# Patient Record
Sex: Female | Born: 1945 | Race: White | Hispanic: No | Marital: Married | Smoking: Never smoker
Health system: Southern US, Community
[De-identification: ages and names within clinical notes are randomized; demographics above are authoritative.]

## PROBLEM LIST (undated history)

## (undated) DIAGNOSIS — I739 Peripheral vascular disease, unspecified: Secondary | ICD-10-CM

## (undated) DIAGNOSIS — M1990 Unspecified osteoarthritis, unspecified site: Secondary | ICD-10-CM

## (undated) DIAGNOSIS — G4730 Sleep apnea, unspecified: Secondary | ICD-10-CM

## (undated) DIAGNOSIS — F419 Anxiety disorder, unspecified: Secondary | ICD-10-CM

## (undated) HISTORY — PX: OTHER SURGICAL HISTORY: SHX169

## (undated) HISTORY — PX: CHOLECYSTECTOMY: SHX55

---

## 2000-08-22 HISTORY — PX: ABDOMINAL HYSTERECTOMY: SHX81

## 2000-08-22 HISTORY — PX: APPENDECTOMY: SHX54

## 2013-08-22 HISTORY — PX: COLONOSCOPY: SHX174

## 2020-11-02 NOTE — Patient Instructions (Addendum)
DUE TO COVID-19 ONLY ONE VISITOR IS ALLOWED TO COME WITH YOU AND STAY IN THE WAITING ROOM ONLY DURING PRE OP AND PROCEDURE DAY OF SURGERY. THE 1 VISITOR  MAY VISIT WITH YOU AFTER SURGERY IN YOUR PRIVATE ROOM DURING VISITING HOURS ONLY!  YOU NEED TO HAVE A COVID 19 TEST ON__3/19_____ @_10 :30____, THIS TEST MUST BE DONE BEFORE SURGERY,  COVID TESTING SITE [address] [place] Tracy Jimenez , IT IS ON THE RIGHT GOING OUT [address] APPROXIMATELY  2 MINUTES PAST [employer] ON THE RIGHT. ONCE YOUR COVID TEST IS COMPLETED,  PLEASE BEGIN THE QUARANTINE INSTRUCTIONS AS OUTLINED IN YOUR HANDOUT.                Tracy Jimenez   Your procedure is scheduled on: 11/11/20   Report to [facility] Main  Entrance   Report to admitting at  10:15 AM     Call this number if you have problems the morning of surgery [phone]    . BRUSH YOUR TEETH MORNING OF SURGERY AND RINSE YOUR MOUTH OUT, NO CHEWING GUM CANDY OR MINTS.    ==================================================================  No food after midnight.    You may have clear liquid until 9:30 AM.    At 9:00 AM drink pre surgery drink  . Nothing by mouth after 9:30 AM.     Take these medicines the morning of surgery with A SIP OF WATER: none                                 You may not have any metal on your body including hair pins and              piercings  Do not wear jewelry, make-up, lotions, powders or perfumes, deodorant             Do not wear nail polish on your fingernails.  Do not shave  48 hours prior to surgery.  k.   Do not bring valuables to the hospital. [facility] IS NOT             RESPONSIBLE   FOR VALUABLES.  Contacts, dentures or bridgework may not be worn into surgery.       Special Instructions: N/A              Please read over the following fact sheets you were given: _____________________________________________________________________             [facility] -  Preparing for Surgery Before surgery, you can play an important role.  Because skin is not sterile, your skin needs to be as free of germs as possible.  You can reduce the number of germs on your skin by washing with CHG (chlorahexidine gluconate) soap before surgery.  CHG is an antiseptic cleaner which kills germs and bonds with the skin to continue killing germs even after washing. Please DO NOT use if you have an allergy to CHG or antibacterial soaps.  If your skin becomes reddened/irritated stop using the CHG and inform your nurse when you arrive at Short Stay. Do not shave (including legs and underarms) for at least 48 hours prior to the first CHG shower.    Please follow these instructions carefully:  1.  Shower with CHG Soap the night before surgery and the  morning of Surgery.  2.  If you choose to wash your hair, wash your hair first  as usual with your  normal  shampoo.  3.  After you shampoo, rinse your hair and body thoroughly to remove the  shampoo.                                        4.  Use CHG as you would any other liquid soap.  You can apply chg directly  to the skin and wash                       Gently with a scrungie or clean washcloth.  5.  Apply the CHG Soap to your body ONLY FROM THE NECK DOWN.   Do not use on face/ open                           Wound or open sores. Avoid contact with eyes, ears mouth and genitals (private parts).                       Wash face,  Genitals (private parts) with your normal soap.             6.  Wash thoroughly, paying special attention to the area where your surgery  will be performed.  7.  Thoroughly rinse your body with warm water from the neck down.  8.  DO NOT shower/wash with your normal soap after using and rinsing off  the CHG Soap.             9.  Pat yourself dry with a clean towel.            10.  Wear clean pajamas.            11.  Place clean sheets on your bed the night of your first shower and do not  sleep with pets. Day  of Surgery : Do not apply any lotions/deodorants the morning of surgery.  Please wear clean clothes to the hospital/surgery center.  FAILURE TO FOLLOW THESE INSTRUCTIONS MAY RESULT IN THE CANCELLATION OF YOUR SURGERY PATIENT SIGNATURE_________________________________  NURSE SIGNATURE__________________________________  ________________________________________________________________________   Tracy Jimenez  An incentive spirometer is a tool that can help keep your lungs clear and active. This tool measures how well you are filling your lungs with each breath. Taking long deep breaths may help reverse or decrease the chance of developing breathing (pulmonary) problems (especially infection) following:  A long period of time when you are unable to move or be active. BEFORE THE PROCEDURE   If the spirometer includes an indicator to show your best effort, your nurse or respiratory therapist will set it to a desired goal.  If possible, sit up straight or lean slightly forward. Try not to slouch.  Hold the incentive spirometer in an upright position. INSTRUCTIONS FOR USE  1. Sit on the edge of your bed if possible, or sit up as far as you can in bed or on a chair. 2. Hold the incentive spirometer in an upright position. 3. Breathe out normally. 4. Place the mouthpiece in your mouth and seal your lips tightly around it. 5. Breathe in slowly and as deeply as possible, raising the piston or the ball toward the top of the column. 6. Hold your breath for 3-5 seconds or for as long as possible. Allow the piston or ball to fall  to the bottom of the column. 7. Remove the mouthpiece from your mouth and breathe out normally. 8. Rest for a few seconds and repeat Steps 1 through 7 at least 10 times every 1-2 hours when you are awake. Take your time and take a few normal breaths between deep breaths. 9. The spirometer may include an indicator to show your best effort. Use the indicator as a goal  to work toward during each repetition. 10. After each set of 10 deep breaths, practice coughing to be sure your lungs are clear. If you have an incision (the cut made at the time of surgery), support your incision when coughing by placing a pillow or rolled up towels firmly against it. Once you are able to get out of bed, walk around indoors and cough well. You may stop using the incentive spirometer when instructed by your caregiver.  RISKS AND COMPLICATIONS  Take your time so you do not get dizzy or light-headed.  If you are in pain, you may need to take or ask for pain medication before doing incentive spirometry. It is harder to take a deep breath if you are having pain. AFTER USE  Rest and breathe slowly and easily.  It can be helpful to keep track of a log of your progress. Your caregiver can provide you with a simple table to help with this. If you are using the spirometer at home, follow these instructions: SEEK MEDICAL CARE IF:   You are having difficultly using the spirometer.  You have trouble using the spirometer as often as instructed.  Your pain medication is not giving enough relief while using the spirometer.  You develop fever of 100.5 F (38.1 C) or higher. SEEK IMMEDIATE MEDICAL CARE IF:   You cough up bloody sputum that had not been present before.  You develop fever of 102 F (38.9 C) or greater.  You develop worsening pain at or near the incision site. MAKE SURE YOU:   Understand these instructions.  Will watch your condition.  Will get help right away if you are not doing well or get worse. Document Released: 12/19/2006 Document Revised: 10/31/2011 Document Reviewed: 02/19/2007 [facility] Patient Information 2014 [place], [place].   ________________________________________________________________________

## 2020-11-03 ENCOUNTER — Encounter (HOSPITAL_COMMUNITY)
Admission: RE | Admit: 2020-11-03 | Discharge: 2020-11-03 | Disposition: A | Discharge status: Home / Self Care | Payer: Medicare Other | Source: Ambulatory Visit | Attending: Orthopedic Surgery | Admitting: Orthopedic Surgery

## 2020-11-03 ENCOUNTER — Encounter (HOSPITAL_COMMUNITY): Payer: Self-pay

## 2020-11-03 ENCOUNTER — Other Ambulatory Visit: Payer: Self-pay

## 2020-11-03 DIAGNOSIS — Z01812 Encounter for preprocedural laboratory examination: Secondary | ICD-10-CM | POA: Diagnosis present

## 2020-11-03 HISTORY — DX: Unspecified osteoarthritis, unspecified site: M19.90

## 2020-11-03 HISTORY — DX: Peripheral vascular disease, unspecified: I73.9

## 2020-11-03 HISTORY — DX: Anxiety disorder, unspecified: F41.9

## 2020-11-03 HISTORY — DX: Sleep apnea, unspecified: G47.30

## 2020-11-03 LAB — COMPREHENSIVE METABOLIC PANEL
ALT: 34 U/L (ref 0–44)
AST: 29 U/L (ref 15–41)
Albumin: 4.5 g/dL (ref 3.5–5.0)
Alkaline Phosphatase: 70 U/L (ref 38–126)
Anion gap: 7 (ref 5–15)
BUN: 14 mg/dL (ref 8–23)
CO2: 28 mmol/L (ref 22–32)
Calcium: 9.8 mg/dL (ref 8.9–10.3)
Chloride: 106 mmol/L (ref 98–111)
Creatinine, Ser: 0.44 mg/dL (ref 0.44–1.00)
GFR, Estimated: >60 mL/min (ref >60)
Glucose, Bld: 112 mg/dL — ABNORMAL HIGH (ref 70–99)
Potassium: 4.5 mmol/L (ref 3.5–5.1)
Sodium: 141 mmol/L (ref 135–145)
Total Bilirubin: 0.6 mg/dL (ref 0.3–1.2)
Total Protein: 7.5 g/dL (ref 6.5–8.1)

## 2020-11-03 LAB — SURGICAL PCR SCREEN
MRSA, PCR: NEGATIVE
Staphylococcus aureus: POSITIVE — AB

## 2020-11-03 LAB — CBC
HCT: 43.1 % (ref 36.0–46.0)
Hemoglobin: 14.1 g/dL (ref 12.0–15.0)
MCH: 32 pg (ref 26.0–34.0)
MCHC: 32.7 g/dL (ref 30.0–36.0)
MCV: 98 fL (ref 80.0–100.0)
Platelets: 258 10*3/uL (ref 150–400)
RBC: 4.4 MIL/uL (ref 3.87–5.11)
RDW: 12.5 % (ref 11.5–15.5)
WBC: 6.9 10*3/uL (ref 4.0–10.5)
nRBC: 0 % (ref 0.0–0.2)

## 2020-11-03 LAB — PROTIME-INR
INR: 1 (ref 0.8–1.2)
Prothrombin Time: 12.3 seconds (ref 11.4–15.2)

## 2020-11-03 LAB — APTT: aPTT: 30 seconds (ref 24–36)

## 2020-11-03 NOTE — Progress Notes (Addendum)
COVID Vaccine Completed:Yes Date COVID Vaccine completed:10/24/19-Booster 07/08/20 COVID vaccine manufacturer: Pfizer     PCP - Dr. Allayne Gitelman Cardiologist - none  Chest x-ray - no EKG - on chat- Epic Stress Test - no ECHO - no Cardiac Cath - no Pacemaker/ICD device last checked:NA  Sleep Study - yes CPAP -yes   Fasting Blood Sugar - NA Checks Blood Sugar _____ times a day  Blood Thinner Instructions: ASA 81/ Dr. Chestine Spore Aspirin Instructions:stop 5 days prior/ aluisio Last Dose:11/05/20  Anesthesia review:   Patient denies shortness of breath, fever, cough and chest pain at PAT appointment yes  Patient verbalized understanding of instructions that were given to them at the PAT appointment. Patient was also instructed that they will need to review over the PAT instructions again at home before surgery.yes  Pt is anxious about this surgery. She reports no SOB climbing stairs, doing house work or with ADLs

## 2020-11-04 NOTE — H&P (Signed)
TOTAL HIP ADMISSION H&P  Patient is admitted for left total hip arthroplasty.  Subjective:  Chief Complaint: Left hip pain  HPI: Tracy Jimenez, 75 y.o. female, has a history of pain and functional disability in the left hip due to arthritis and patient has failed non-surgical conservative treatments for greater than 12 weeks to include corticosteriod injections and activity modification. Onset of symptoms was gradual, starting several years ago with gradually worsening course since that time. The patient noted no past surgery on the left hip. Patient currently rates pain in the left hip at 7 out of 10 with activity. Patient has worsening of pain with activity and weight bearing, pain that interfers with activities of daily living, pain with passive range of motion and crepitus. Patient has evidence of periarticular osteophytes and joint space narrowing by imaging studies. This condition presents safety issues increasing the risk of falls. There is no current active infection.  There are no problems to display for this patient.   Past Medical History:  Diagnosis Date  . Anxiety   . Arthritis    knees, hips  . Peripheral vascular disease (HCC)    peripheral neuropathy Rt hand  . Sleep apnea     Past Surgical History:  Procedure Laterality Date  . ABDOMINAL HYSTERECTOMY  2002  . APPENDECTOMY  2002  . carpel tunnel Left   . CHOLECYSTECTOMY    . COLONOSCOPY  2015    Prior to Admission medications   Medication Sig Start Date End Date Taking? Authorizing Provider  aspirin EC 81 MG tablet Take 81 mg by mouth daily. Swallow whole.   Yes [provider]  atorvastatin (LIPITOR) 10 MG tablet Take 10 mg by mouth daily. 10/03/20  Yes [provider]  Calcium Carbonate-Vitamin D (CALCIUM-VITAMIN D) 600-125 MG-UNIT TABS Take 1 tablet by mouth daily.   Yes [provider]  Boris Lown Oil (OMEGA-3) 500 MG CAPS Take 500 mg by mouth daily.   Yes [provider]   Multiple Vitamins-Minerals (MULTIVITAMIN WITH MINERALS) tablet Take 1 tablet by mouth daily.   Yes [provider]  triamcinolone (NASACORT ALLERGY 24HR) 55 MCG/ACT AERO nasal inhaler Place 2 sprays into the nose daily as needed (allergies and Sinus).   Yes [provider]    Allergies  Allergen Reactions  . Mobic [Meloxicam] Hives and Itching  . Kenalog [Triamcinolone] Itching and Rash    Social History   Socioeconomic History  . Marital status: Married    Spouse name: Not on file  . Number of children: Not on file  . Years of education: Not on file  . Highest education level: Not on file  Occupational History  . Not on file  Tobacco Use  . Smoking status: Never Smoker  . Smokeless tobacco: Never Used  Vaping Use  . Vaping Use: Never used  Substance and Sexual Activity  . Alcohol use: Yes    Alcohol/week: 5.0 standard drinks    Types: 5 Standard drinks or equivalent per week  . Drug use: Never  . Sexual activity: Not on file  Other Topics Concern  . Not on file  Social History Narrative  . Not on file   Social Determinants of Health   Financial Resource Strain: Not on file  Food Insecurity: Not on file  Transportation Needs: Not on file  Physical Activity: Not on file  Stress: Not on file  Social Connections: Not on file  Intimate Partner Violence: Not on file    Tobacco Use:  Low Risk   . Smoking Tobacco Use: Never Smoker  . Smokeless Tobacco Use: Never Used   Social History   Substance and Sexual Activity  Alcohol Use Yes  . Alcohol/week: 5.0 standard drinks  . Types: 5 Standard drinks or equivalent per week    No family history on file.  Review of Systems  Constitutional: Negative for chills and fever.  HENT: Negative for congestion, sore throat and tinnitus.   Eyes: Negative for double vision, photophobia and pain.  Respiratory: Negative for cough, shortness of breath and wheezing.   Cardiovascular: Negative for chest pain,  palpitations and orthopnea.  Gastrointestinal: Negative for heartburn, nausea and vomiting.  Genitourinary: Negative for dysuria, frequency and urgency.  Musculoskeletal: Positive for joint pain.  Neurological: Negative for dizziness, weakness and headaches.     Objective:  Physical Exam: Well nourished and well developed.  General: Alert and oriented x3, cooperative and pleasant, no acute distress.  Head: normocephalic, atraumatic, neck supple.  Eyes: EOMI.  Respiratory: breath sounds clear in all fields, no wheezing, rales, or rhonchi. Cardiovascular: Regular rate and rhythm, no murmurs, gallops or rubs.  Abdomen: non-tender to palpation and soft, normoactive bowel sounds. Musculoskeletal:  Left Hip Exam:  ROM: Flexion to 100, Internal Rotation 0, External Rotation 20, and abduction 20 without discomfort.  There is no tenderness over the greater trochanter.  there is no pain on provocative testing of the hip.  Calves soft and nontender. Motor function intact in LE. Strength 5/5 LE bilaterally. Neuro: Distal pulses 2+. Sensation to light touch intact in LE.  Vital signs in last 24 hours: Temp:  [98.5 F (36.9 C)] 98.5 F (36.9 C) (03/15 0949) Pulse Rate:  [71] 71 (03/15 0949) Resp:  [20] 20 (03/15 0949) BP: (157)/(68) 157/68 (03/15 0949) SpO2:  [100 %] 100 % (03/15 0949) Weight:  [57.2 kg] 57.2 kg (03/15 0949)  Imaging Review Plain radiographs demonstrate severe degenerative joint disease of the left hip. The bone quality appears to be adequate for age and reported activity level.  Assessment/Plan:  End stage arthritis, left hip  The patient history, physical examination, clinical judgement of the provider and imaging studies are consistent with end stage degenerative joint disease of the left hip and total hip arthroplasty is deemed medically necessary. The treatment options including medical management, injection therapy, arthroscopy and arthroplasty were discussed at  length. The risks and benefits of total hip arthroplasty were presented and reviewed. The risks due to aseptic loosening, infection, stiffness, dislocation/subluxation, thromboembolic complications and other imponderables were discussed. The patient acknowledged the explanation, agreed to proceed with the plan and consent was signed. Patient is being admitted for inpatient treatment for surgery, pain control, PT, OT, prophylactic antibiotics, VTE prophylaxis, progressive ambulation and ADLs and discharge planning.The patient is planning to be discharged home.   Patient's anticipated LOS is less than 2 midnights, meeting these requirements: - Younger than 81 - Lives within 1 hour of care - Has a competent adult at home to recover with post-op recover - NO history of  - Chronic pain requiring opiods  - Diabetes  - Coronary Artery Disease  - Heart failure  - Heart attack  - Stroke  - DVT/VTE  - Cardiac arrhythmia  - Respiratory Failure/COPD  - Renal failure  - Anemia  - Advanced Liver disease  Therapy Plans: HEP Disposition: Home with husband Planned DVT Prophylaxis: Aspirin 325 mg BID DME Needed: None PCP: Vernona Rieger, NP (clearance received) TXA: IV Allergies: Meloxicam (rash), kenalog (rash)  Anesthesia Concerns: None BMI: 22 Last HgbA1c: Not diabetic.  Pharmacy: [facility] [address], [place])  - Patient was instructed on what medications to stop prior to surgery. - Follow-up visit in 2 weeks with Dr. Lequita Halt - Begin physical therapy following surgery - Pre-operative lab work as pre-surgical testing - Prescriptions will be provided in hospital at time of discharge  Arther Abbott, PA-C Orthopedic Surgery [facility] [place]

## 2020-11-07 ENCOUNTER — Other Ambulatory Visit (HOSPITAL_COMMUNITY)
Admission: RE | Admit: 2020-11-07 | Discharge: 2020-11-07 | Disposition: A | Discharge status: Home / Self Care | Payer: Medicare Other | Source: Ambulatory Visit | Attending: Orthopedic Surgery | Admitting: Orthopedic Surgery

## 2020-11-07 DIAGNOSIS — Z01812 Encounter for preprocedural laboratory examination: Secondary | ICD-10-CM | POA: Diagnosis present

## 2020-11-07 DIAGNOSIS — Z20822 Contact with and (suspected) exposure to covid-19: Secondary | ICD-10-CM | POA: Insufficient documentation

## 2020-11-08 LAB — SARS CORONAVIRUS 2 (TAT 6-24 HRS): SARS Coronavirus 2: NEGATIVE

## 2020-11-11 ENCOUNTER — Other Ambulatory Visit: Payer: Self-pay

## 2020-11-11 ENCOUNTER — Observation Stay (HOSPITAL_COMMUNITY)
Admission: RE | Admit: 2020-11-11 | Discharge: 2020-11-13 | Disposition: A | Discharge status: Home / Self Care | Payer: Medicare Other | Source: Ambulatory Visit | Attending: Orthopedic Surgery | Admitting: Orthopedic Surgery

## 2020-11-11 ENCOUNTER — Ambulatory Visit (HOSPITAL_COMMUNITY): Payer: Medicare Other

## 2020-11-11 ENCOUNTER — Ambulatory Visit (HOSPITAL_COMMUNITY): Payer: Medicare Other | Admitting: Certified Registered"

## 2020-11-11 ENCOUNTER — Encounter (HOSPITAL_COMMUNITY)
Admission: RE | Disposition: A | Discharge status: Home / Self Care | Payer: Self-pay | Source: Ambulatory Visit | Attending: Orthopedic Surgery

## 2020-11-11 ENCOUNTER — Observation Stay (HOSPITAL_COMMUNITY): Payer: Medicare Other

## 2020-11-11 ENCOUNTER — Encounter (HOSPITAL_COMMUNITY): Payer: Self-pay | Admitting: Orthopedic Surgery

## 2020-11-11 DIAGNOSIS — Z888 Allergy status to other drugs, medicaments and biological substances status: Secondary | ICD-10-CM | POA: Insufficient documentation

## 2020-11-11 DIAGNOSIS — M25552 Pain in left hip: Secondary | ICD-10-CM | POA: Diagnosis present

## 2020-11-11 DIAGNOSIS — Z79899 Other long term (current) drug therapy: Secondary | ICD-10-CM | POA: Diagnosis not present

## 2020-11-11 DIAGNOSIS — Z96649 Presence of unspecified artificial hip joint: Secondary | ICD-10-CM

## 2020-11-11 DIAGNOSIS — Z7982 Long term (current) use of aspirin: Secondary | ICD-10-CM | POA: Diagnosis not present

## 2020-11-11 DIAGNOSIS — M169 Osteoarthritis of hip, unspecified: Secondary | ICD-10-CM

## 2020-11-11 DIAGNOSIS — M1612 Unilateral primary osteoarthritis, left hip: Secondary | ICD-10-CM | POA: Diagnosis not present

## 2020-11-11 HISTORY — PX: TOTAL HIP ARTHROPLASTY: SHX124

## 2020-11-11 LAB — TYPE AND SCREEN
ABO/RH(D): O POS
Antibody Screen: NEGATIVE

## 2020-11-11 LAB — ABO/RH: ABO/RH(D): O POS

## 2020-11-11 SURGERY — ARTHROPLASTY, HIP, TOTAL, ANTERIOR APPROACH
Anesthesia: Spinal | Site: Hip | Laterality: Left

## 2020-11-11 MED ORDER — METOCLOPRAMIDE HCL 5 MG PO TABS
5.0000 mg | ORAL_TABLET | Freq: Three times a day (TID) | ORAL | Status: DC | PRN
Start: 2020-11-11 — End: 2020-11-13

## 2020-11-11 MED ORDER — CEFAZOLIN SODIUM-DEXTROSE 2-4 GM/100ML-% IV SOLN
2.0000 g | INTRAVENOUS | Status: AC
  Administered 2020-11-11: 2 g via INTRAVENOUS
  Filled 2020-11-11: qty 100

## 2020-11-11 MED ORDER — LACTATED RINGERS IV SOLN: INTRAVENOUS | Status: DC

## 2020-11-11 MED ORDER — ONDANSETRON HCL 4 MG/2ML IJ SOLN
INTRAMUSCULAR | Status: AC
  Filled 2020-11-11: qty 2

## 2020-11-11 MED ORDER — MIDAZOLAM HCL 2 MG/2ML IJ SOLN
INTRAMUSCULAR | Status: AC
  Filled 2020-11-11: qty 2

## 2020-11-11 MED ORDER — ORAL CARE MOUTH RINSE: 15.0000 mL | Freq: Once | OROMUCOSAL | Status: AC

## 2020-11-11 MED ORDER — BISACODYL 10 MG RE SUPP: 10.0000 mg | Freq: Every day | RECTAL | Status: DC | PRN

## 2020-11-11 MED ORDER — HYDROCODONE-ACETAMINOPHEN 5-325 MG PO TABS
1.0000 | ORAL_TABLET | ORAL | Status: DC | PRN
Start: 2020-11-11 — End: 2020-11-12
  Administered 2020-11-11 (×2): 2 via ORAL
  Filled 2020-11-11 (×2): qty 2

## 2020-11-11 MED ORDER — ONDANSETRON HCL 4 MG/2ML IJ SOLN
4.0000 mg | Freq: Four times a day (QID) | INTRAMUSCULAR | Status: DC | PRN
  Administered 2020-11-11 – 2020-11-12 (×2): 4 mg via INTRAVENOUS
  Filled 2020-11-11 (×2): qty 2

## 2020-11-11 MED ORDER — TRIAMCINOLONE ACETONIDE 55 MCG/ACT NA AERO
2.0000 | INHALATION_SPRAY | Freq: Every day | NASAL | Status: DC | PRN
  Filled 2020-11-11: qty 21.6

## 2020-11-11 MED ORDER — PROPOFOL 10 MG/ML IV BOLUS
INTRAVENOUS | Status: AC
  Filled 2020-11-11: qty 20

## 2020-11-11 MED ORDER — TRANEXAMIC ACID-NACL 1000-0.7 MG/100ML-% IV SOLN
1000.0000 mg | INTRAVENOUS | Status: AC
  Administered 2020-11-11: 1000 mg via INTRAVENOUS
  Filled 2020-11-11: qty 100

## 2020-11-11 MED ORDER — MENTHOL 3 MG MT LOZG: 1.0000 | LOZENGE | OROMUCOSAL | Status: DC | PRN

## 2020-11-11 MED ORDER — METHOCARBAMOL 500 MG PO TABS
500.0000 mg | ORAL_TABLET | Freq: Four times a day (QID) | ORAL | Status: DC | PRN
  Administered 2020-11-11 – 2020-11-12 (×2): 500 mg via ORAL
  Filled 2020-11-11 (×2): qty 1

## 2020-11-11 MED ORDER — ATORVASTATIN CALCIUM 10 MG PO TABS
10.0000 mg | ORAL_TABLET | Freq: Every day | ORAL | Status: DC
  Administered 2020-11-12 – 2020-11-13 (×2): 10 mg via ORAL
  Filled 2020-11-11 (×2): qty 1

## 2020-11-11 MED ORDER — MIDAZOLAM HCL 2 MG/2ML IJ SOLN
INTRAMUSCULAR | Status: DC | PRN
  Administered 2020-11-11 (×2): 1 mg via INTRAVENOUS

## 2020-11-11 MED ORDER — PROPOFOL 500 MG/50ML IV EMUL
INTRAVENOUS | Status: DC | PRN
  Administered 2020-11-11: 40 ug/kg/min via INTRAVENOUS

## 2020-11-11 MED ORDER — TRAMADOL HCL 50 MG PO TABS: 50.0000 mg | ORAL_TABLET | Freq: Four times a day (QID) | ORAL | Status: DC | PRN

## 2020-11-11 MED ORDER — DOCUSATE SODIUM 100 MG PO CAPS
100.0000 mg | ORAL_CAPSULE | Freq: Two times a day (BID) | ORAL | Status: DC
  Administered 2020-11-11 – 2020-11-13 (×3): 100 mg via ORAL
  Filled 2020-11-11 (×3): qty 1

## 2020-11-11 MED ORDER — PHENOL 1.4 % MT LIQD: 1.0000 | OROMUCOSAL | Status: DC | PRN

## 2020-11-11 MED ORDER — ONDANSETRON HCL 4 MG/2ML IJ SOLN
INTRAMUSCULAR | Status: DC | PRN
  Administered 2020-11-11: 4 mg via INTRAVENOUS

## 2020-11-11 MED ORDER — SODIUM CHLORIDE 0.9 % IV SOLN: INTRAVENOUS | Status: DC

## 2020-11-11 MED ORDER — FENTANYL CITRATE (PF) 100 MCG/2ML IJ SOLN
INTRAMUSCULAR | Status: DC | PRN
  Administered 2020-11-11 (×2): 25 ug via INTRAVENOUS
  Administered 2020-11-11: 50 ug via INTRAVENOUS

## 2020-11-11 MED ORDER — FENTANYL CITRATE (PF) 100 MCG/2ML IJ SOLN
INTRAMUSCULAR | Status: AC
  Filled 2020-11-11: qty 2

## 2020-11-11 MED ORDER — PROPOFOL 1000 MG/100ML IV EMUL
INTRAVENOUS | Status: AC
  Filled 2020-11-11: qty 100

## 2020-11-11 MED ORDER — DEXAMETHASONE SODIUM PHOSPHATE 10 MG/ML IJ SOLN
INTRAMUSCULAR | Status: AC
  Filled 2020-11-11: qty 1

## 2020-11-11 MED ORDER — MORPHINE SULFATE (PF) 2 MG/ML IV SOLN: 0.5000 mg | INTRAVENOUS | Status: DC | PRN

## 2020-11-11 MED ORDER — MAGNESIUM CITRATE PO SOLN: 1.0000 | Freq: Once | ORAL | Status: DC | PRN

## 2020-11-11 MED ORDER — DEXAMETHASONE SODIUM PHOSPHATE 10 MG/ML IJ SOLN
8.0000 mg | Freq: Once | INTRAMUSCULAR | Status: AC
  Administered 2020-11-11: 8 mg via INTRAVENOUS

## 2020-11-11 MED ORDER — ACETAMINOPHEN 325 MG PO TABS
325.0000 mg | ORAL_TABLET | Freq: Four times a day (QID) | ORAL | Status: DC | PRN
  Administered 2020-11-12 – 2020-11-13 (×2): 650 mg via ORAL
  Filled 2020-11-11 (×2): qty 2

## 2020-11-11 MED ORDER — BUPIVACAINE IN DEXTROSE 0.75-8.25 % IT SOLN
INTRATHECAL | Status: DC | PRN
  Administered 2020-11-11: 1.6 mL via INTRATHECAL

## 2020-11-11 MED ORDER — ONDANSETRON HCL 4 MG PO TABS: 4.0000 mg | ORAL_TABLET | Freq: Four times a day (QID) | ORAL | Status: DC | PRN

## 2020-11-11 MED ORDER — CHLORHEXIDINE GLUCONATE 0.12 % MT SOLN
15.0000 mL | Freq: Once | OROMUCOSAL | Status: AC
  Administered 2020-11-11: 15 mL via OROMUCOSAL

## 2020-11-11 MED ORDER — DEXAMETHASONE SODIUM PHOSPHATE 10 MG/ML IJ SOLN
10.0000 mg | Freq: Once | INTRAMUSCULAR | Status: AC
  Administered 2020-11-12: 10 mg via INTRAVENOUS
  Filled 2020-11-11: qty 1

## 2020-11-11 MED ORDER — ASPIRIN EC 325 MG PO TBEC
325.0000 mg | DELAYED_RELEASE_TABLET | Freq: Two times a day (BID) | ORAL | Status: DC
  Administered 2020-11-12 – 2020-11-13 (×3): 325 mg via ORAL
  Filled 2020-11-11 (×3): qty 1

## 2020-11-11 MED ORDER — LIDOCAINE 2% (20 MG/ML) 5 ML SYRINGE
INTRAMUSCULAR | Status: AC
  Filled 2020-11-11: qty 5

## 2020-11-11 MED ORDER — WATER FOR IRRIGATION, STERILE IR SOLN
Status: DC | PRN
  Administered 2020-11-11 (×2): 1000 mL

## 2020-11-11 MED ORDER — ACETAMINOPHEN 10 MG/ML IV SOLN
1000.0000 mg | Freq: Four times a day (QID) | INTRAVENOUS | Status: AC
  Administered 2020-11-11 – 2020-11-12 (×3): 1000 mg via INTRAVENOUS
  Filled 2020-11-11 (×4): qty 100

## 2020-11-11 MED ORDER — POLYETHYLENE GLYCOL 3350 17 G PO PACK: 17.0000 g | PACK | Freq: Every day | ORAL | Status: DC | PRN

## 2020-11-11 MED ORDER — BUPIVACAINE HCL 0.25 % IJ SOLN
INTRAMUSCULAR | Status: DC | PRN
  Administered 2020-11-11: 30 mL

## 2020-11-11 MED ORDER — FENTANYL CITRATE (PF) 100 MCG/2ML IJ SOLN
25.0000 ug | INTRAMUSCULAR | Status: DC | PRN
  Administered 2020-11-11 (×3): 50 ug via INTRAVENOUS

## 2020-11-11 MED ORDER — METOCLOPRAMIDE HCL 5 MG/ML IJ SOLN
5.0000 mg | Freq: Three times a day (TID) | INTRAMUSCULAR | Status: DC | PRN
  Administered 2020-11-12: 5 mg via INTRAVENOUS
  Filled 2020-11-11: qty 2

## 2020-11-11 MED ORDER — CEFAZOLIN SODIUM-DEXTROSE 2-4 GM/100ML-% IV SOLN
2.0000 g | Freq: Four times a day (QID) | INTRAVENOUS | Status: AC
  Administered 2020-11-11 – 2020-11-12 (×2): 2 g via INTRAVENOUS
  Filled 2020-11-11 (×2): qty 100

## 2020-11-11 MED ORDER — 0.9 % SODIUM CHLORIDE (POUR BTL) OPTIME
TOPICAL | Status: DC | PRN
  Administered 2020-11-11: 1000 mL

## 2020-11-11 MED ORDER — METHOCARBAMOL 1000 MG/10ML IJ SOLN
500.0000 mg | Freq: Four times a day (QID) | INTRAVENOUS | Status: DC | PRN
  Filled 2020-11-11: qty 5

## 2020-11-11 MED ORDER — ACETAMINOPHEN 10 MG/ML IV SOLN
1000.0000 mg | Freq: Four times a day (QID) | INTRAVENOUS | Status: DC
  Administered 2020-11-11: 1000 mg via INTRAVENOUS
  Filled 2020-11-11: qty 100

## 2020-11-11 MED ORDER — POVIDONE-IODINE 10 % EX SWAB
2.0000 "application " | Freq: Once | CUTANEOUS | Status: AC
  Administered 2020-11-11: 2 via TOPICAL

## 2020-11-11 SURGICAL SUPPLY — 43 items
BAG DECANTER FOR FLEXI CONT (MISCELLANEOUS) IMPLANT
BAG ZIPLOCK 12X15 (MISCELLANEOUS) IMPLANT
BLADE SAG 18X100X1.27 (BLADE) ×2 IMPLANT
COVER PERINEAL POST (MISCELLANEOUS) ×2 IMPLANT
COVER SURGICAL LIGHT HANDLE (MISCELLANEOUS) ×2 IMPLANT
COVER WAND RF STERILE (DRAPES) IMPLANT
CUP ACET PINNACLE SECTR 50MM (Hips) ×1 IMPLANT
DECANTER SPIKE VIAL GLASS SM (MISCELLANEOUS) ×2 IMPLANT
DRAPE STERI IOBAN 125X83 (DRAPES) ×2 IMPLANT
DRAPE U-SHAPE 47X51 STRL (DRAPES) ×4 IMPLANT
DRSG AQUACEL AG ADV 3.5X10 (GAUZE/BANDAGES/DRESSINGS) ×2 IMPLANT
DURAPREP 26ML APPLICATOR (WOUND CARE) ×2 IMPLANT
ELECT REM PT RETURN 15FT ADLT (MISCELLANEOUS) ×2 IMPLANT
EVACUATOR 1/8 PVC DRAIN (DRAIN) IMPLANT
GLOVE SRG 8 PF TXTR STRL LF DI (GLOVE) ×1 IMPLANT
GLOVE SURG ENC MOIS LTX SZ6 (GLOVE) IMPLANT
GLOVE SURG ENC MOIS LTX SZ6.5 (GLOVE) ×2 IMPLANT
GLOVE SURG ENC MOIS LTX SZ8 (GLOVE) ×4 IMPLANT
GLOVE SURG ENC TEXT LTX SZ7 (GLOVE) IMPLANT
GLOVE SURG UNDER POLY LF SZ6.5 (GLOVE) IMPLANT
GLOVE SURG UNDER POLY LF SZ8 (GLOVE) ×1
GLOVE SURG UNDER POLY LF SZ8.5 (GLOVE) IMPLANT
GOWN STRL REUS W/TWL LRG LVL3 (GOWN DISPOSABLE) ×2 IMPLANT
GOWN STRL REUS W/TWL XL LVL3 (GOWN DISPOSABLE) IMPLANT
HEAD FEM STD 32X+1 STRL (Hips) ×2 IMPLANT
HOLDER FOLEY CATH W/STRAP (MISCELLANEOUS) ×2 IMPLANT
KIT TURNOVER KIT A (KITS) ×2 IMPLANT
LINER MARATHON 32 50 (Hips) ×2 IMPLANT
MANIFOLD NEPTUNE II (INSTRUMENTS) ×2 IMPLANT
PACK ANTERIOR HIP CUSTOM (KITS) ×2 IMPLANT
PENCIL SMOKE EVACUATOR COATED (MISCELLANEOUS) ×2 IMPLANT
PINNACLE SECTOR CUP 50MM (Hips) ×2 IMPLANT
STEM FEM ACTIS STD SZ4 (Stem) ×2 IMPLANT
STRIP CLOSURE SKIN 1/2X4 (GAUZE/BANDAGES/DRESSINGS) ×2 IMPLANT
SUT ETHIBOND NAB CT1 #1 30IN (SUTURE) ×2 IMPLANT
SUT MNCRL AB 4-0 PS2 18 (SUTURE) ×2 IMPLANT
SUT STRATAFIX 0 PDS 27 VIOLET (SUTURE) ×2
SUT VIC AB 2-0 CT1 27 (SUTURE) ×2
SUT VIC AB 2-0 CT1 TAPERPNT 27 (SUTURE) ×2 IMPLANT
SUTURE STRATFX 0 PDS 27 VIOLET (SUTURE) ×1 IMPLANT
SYR 50ML LL SCALE MARK (SYRINGE) IMPLANT
TRAY FOLEY MTR SLVR 16FR STAT (SET/KITS/TRAYS/PACK) ×2 IMPLANT
TUBE SUCTION HIGH CAP CLEAR NV (SUCTIONS) ×2 IMPLANT

## 2020-11-11 NOTE — Anesthesia Postprocedure Evaluation (Signed)
Anesthesia Post Note  Patient: SELINDA KORZENIEWSKI  Procedure(s) Performed: TOTAL HIP ARTHROPLASTY ANTERIOR APPROACH (Left Hip)     Patient location during evaluation: PACU Anesthesia Type: Spinal Level of consciousness: oriented and awake and alert Pain management: pain level controlled Vital Signs Assessment: post-procedure vital signs reviewed and stable Respiratory status: spontaneous breathing, respiratory function stable and patient connected to nasal cannula oxygen Cardiovascular status: blood pressure returned to baseline and stable Postop Assessment: no headache, no backache, no apparent nausea or vomiting, spinal receding and patient able to bend at knees Anesthetic complications: no   No complications documented.  Last Vitals:  Vitals:   11/11/20 1545 11/11/20 1600  BP: (!) 141/62 (!) 152/70  Pulse: (!) 57 68  Resp: (!) 9 14  Temp:    SpO2: 100% 100%    Last Pain:  Vitals:   11/11/20 1600  TempSrc:   PainSc: Asleep                 Ann Groeneveld,W. EDMOND

## 2020-11-11 NOTE — Anesthesia Preprocedure Evaluation (Addendum)
Anesthesia Evaluation  Patient identified by MRN, date of birth, ID band Patient awake    Reviewed: Allergy & Precautions, H&P , NPO status , Patient's Chart, lab work & pertinent test results  Airway Mallampati: III  TM Distance: >3 FB Neck ROM: Full    Dental no notable dental hx. (+) Teeth Intact, Dental Advisory Given   Pulmonary sleep apnea and Continuous Positive Airway Pressure Ventilation ,    Pulmonary exam normal breath sounds clear to auscultation       Cardiovascular + Peripheral Vascular Disease   Rhythm:Regular Rate:Normal     Neuro/Psych Anxiety negative neurological ROS     GI/Hepatic negative GI ROS, Neg liver ROS,   Endo/Other  negative endocrine ROS  Renal/GU negative Renal ROS  negative genitourinary   Musculoskeletal  (+) Arthritis , Osteoarthritis,    Abdominal   Peds  Hematology negative hematology ROS (+)   Anesthesia Other Findings   Reproductive/Obstetrics negative OB ROS                           Anesthesia Physical Anesthesia Plan  ASA: III  Anesthesia Plan: Spinal   Post-op Pain Management:    Induction: Intravenous  PONV Risk Score and Plan: 3 and Propofol infusion, Ondansetron and Dexamethasone  Airway Management Planned: Simple Face Mask  Additional Equipment:   Intra-op Plan:   Post-operative Plan:   Informed Consent: I have reviewed the patients History and Physical, chart, labs and discussed the procedure including the risks, benefits and alternatives for the proposed anesthesia with the patient or authorized representative who has indicated his/her understanding and acceptance.     Dental advisory given  Plan Discussed with: CRNA  Anesthesia Plan Comments:        Anesthesia Quick Evaluation

## 2020-11-11 NOTE — Interval H&P Note (Signed)
History and Physical Interval Note:  11/11/2020 10:38 AM  Tracy Jimenez  has presented today for surgery, with the diagnosis of Left hip osteoarthritis.  The various methods of treatment have been discussed with the patient and family. After consideration of risks, benefits and other options for treatment, the patient has consented to  Procedure(s) with comments: TOTAL HIP ARTHROPLASTY ANTERIOR APPROACH (Left) - as a surgical intervention.  The patient's history has been reviewed, patient examined, no change in status, stable for surgery.  I have reviewed the patient's chart and labs.  Questions were answered to the patient's satisfaction.     Homero Fellers Hoover Grewe

## 2020-11-11 NOTE — Anesthesia Procedure Notes (Signed)
Procedure Name: MAC Date/Time: 11/11/2020 1:14 PM Performed by: Eben Burow, CRNA Pre-anesthesia Checklist: Patient identified, Emergency Drugs available, Suction available, Patient being monitored and Timeout performed Oxygen Delivery Method: Simple face mask

## 2020-11-11 NOTE — Op Note (Signed)
OPERATIVE REPORT- TOTAL HIP ARTHROPLASTY   PREOPERATIVE DIAGNOSIS: Osteoarthritis of the Left hip.   POSTOPERATIVE DIAGNOSIS: Osteoarthritis of the Left  hip.   PROCEDURE: Left total hip arthroplasty, anterior approach.   SURGEON: Ollen Gross, MD   ASSISTANT: Nelia Shi, PA-C  ANESTHESIA:  Spinal  ESTIMATED BLOOD LOSS:-350 mL    DRAINS: Hemovac x1.   COMPLICATIONS: None   CONDITION: PACU - hemodynamically stable.   BRIEF CLINICAL NOTE: Tracy Jimenez is a 75 y.o. female who has advanced end-  stage arthritis of their Left  hip with progressively worsening pain and  dysfunction.The patient has failed nonoperative management and presents for  total hip arthroplasty.   PROCEDURE IN DETAIL: After successful administration of spinal  anesthetic, the traction boots for the [facility] bed were placed on both  feet and the patient was placed onto the [facility] bed, boots placed into the leg  holders. The Left hip was then isolated from the perineum with plastic  drapes and prepped and draped in the usual sterile fashion. ASIS and  greater trochanter were marked and a oblique incision was made, starting  at about 1 cm lateral and 2 cm distal to the ASIS and coursing towards  the anterior cortex of the femur. The skin was cut with a 10 blade  through subcutaneous tissue to the level of the fascia overlying the  tensor fascia lata muscle. The fascia was then incised in line with the  incision at the junction of the anterior third and posterior 2/3rd. The  muscle was teased off the fascia and then the interval between the TFL  and the rectus was developed. The Hohmann retractor was then placed at  the top of the femoral neck over the capsule. The vessels overlying the  capsule were cauterized and the fat on top of the capsule was removed.  A Hohmann retractor was then placed anterior underneath the rectus  femoris to give exposure to the entire anterior capsule. A T-shaped   capsulotomy was performed. The edges were tagged and the femoral head  was identified.       Osteophytes are removed off the superior acetabulum.  The femoral neck was then cut in situ with an oscillating saw. Traction  was then applied to the left lower extremity utilizing the [facility]  traction. The femoral head was then removed. Retractors were placed  around the acetabulum and then circumferential removal of the labrum was  performed. Osteophytes were also removed. Reaming starts at 47 mm to  medialize and  Increased in 2 mm increments to 49 mm. We reamed in  approximately 40 degrees of abduction, 20 degrees anteversion. A 50 mm  pinnacle acetabular shell was then impacted in anatomic position under  fluoroscopic guidance with excellent purchase. We did not need to place  any additional dome screws. A 32 mm neutral + 4 marathon liner was then  placed into the acetabular shell.       The femoral lift was then placed along the lateral aspect of the femur  just distal to the vastus ridge. The leg was  externally rotated and capsule  was stripped off the inferior aspect of the femoral neck down to the  level of the lesser trochanter, this was done with electrocautery. The femur was lifted after this was performed. The  leg was then placed in an extended and adducted position essentially delivering the femur. We also removed the capsule superiorly and the piriformis from the piriformis  fossa to gain excellent exposure of the  proximal femur. Rongeur was used to remove some cancellous bone to get  into the lateral portion of the proximal femur for placement of the  initial starter reamer. The starter broaches was placed  the starter broach  and was shown to go down the center of the canal. Broaching  with the Actis system was then performed starting at size 0  coursing  Up to size 4. A size 4 had excellent torsional and rotational  and axial stability. The trial standard offset neck was then  placed  with a 32 + 1 trial head. The hip was then reduced. We confirmed that  the stem was in the canal both on AP and lateral x-rays. It also has excellent sizing. The hip was reduced with outstanding stability through full extension and full external rotation.. AP pelvis was taken and the leg lengths were measured and found to be equal. Hip was then dislocated again and the femoral head and neck removed. The  femoral broach was removed. Size 4 Actis stem with a standard offset  neck was then impacted into the femur following native anteversion. Has  excellent purchase in the canal. Excellent torsional and rotational and  axial stability. It is confirmed to be in the canal on AP and lateral  fluoroscopic views. The 32 + 1 metal head was placed and the hip  reduced with outstanding stability. Again AP pelvis was taken and it  confirmed that the leg lengths were equal. The wound was then copiously  irrigated with saline solution and the capsule reattached and repaired  with Ethibond suture. 30 ml of .25% Bupivicaine was  injected into the capsule and into the edge of the tensor fascia lata as well as subcutaneous tissue. The fascia overlying the tensor fascia lata was then closed with a running #1 V-Loc. Subcu was closed with interrupted 2-0 Vicryl and subcuticular running 4-0 Monocryl. Incision was cleaned  and dried. Steri-Strips and a bulky sterile dressing applied. Hemovac  drain was hooked to suction and then the patient was awakened and transported to  recovery in stable condition.        Please note that a surgical assistant was a medical necessity for this procedure to perform it in a safe and expeditious manner. Assistant was necessary to provide appropriate retraction of vital neurovascular structures and to prevent femoral fracture and allow for anatomic placement of the prosthesis.  Ollen Gross, M.D.

## 2020-11-11 NOTE — Plan of Care (Signed)
  Problem: Education: Goal: Knowledge of the prescribed therapeutic regimen will improve Outcome: Progressing Goal: Understanding of discharge needs will improve Outcome: Progressing   

## 2020-11-11 NOTE — Discharge Instructions (Signed)
Frank Aluisio, MD Total Joint Specialist [facility] [place] [address] [place], [place] [address] [phone]  ANTERIOR APPROACH TOTAL HIP REPLACEMENT POSTOPERATIVE DIRECTIONS     Hip Rehabilitation, Guidelines Following Surgery  The results of a hip operation are greatly improved after range of motion and muscle strengthening exercises. Follow all safety measures which are given to protect your hip. If any of these exercises cause increased pain or swelling in your joint, decrease the amount until you are comfortable again. Then slowly increase the exercises. Call your caregiver if you have problems or questions.   BLOOD CLOT PREVENTION . Take a 325 mg Aspirin two times a day for three weeks following surgery. Then take an 81 mg Aspirin once a day for three weeks. Then discontinue Aspirin. . You may resume your vitamins/supplements upon discharge from the hospital. . Do not take any NSAIDs (Advil, Aleve, Ibuprofen, Meloxicam, etc.) until you have discontinued the 325 mg Aspirin.  HOME CARE INSTRUCTIONS  . Remove items at home which could result in a fall. This includes throw rugs or furniture in walking pathways.   ICE to the affected hip as frequently as 20-30 minutes an hour and then as needed for pain and swelling. Continue to use ice on the hip for pain and swelling from surgery. You may notice swelling that will progress down to the foot and ankle. This is normal after surgery. Elevate the leg when you are not up walking on it.    Continue to use the breathing machine which will help keep your temperature down.  It is common for your temperature to cycle up and down following surgery, especially at night when you are not up moving around and exerting yourself.  The breathing machine keeps your lungs expanded and your temperature down.  DIET You may resume your previous home diet once your are discharged from the hospital.  DRESSING / WOUND CARE /  SHOWERING . You have an adhesive waterproof bandage over the incision. Leave this in place until your first follow-up appointment. Once you remove this you will not need to place another bandage.  . You may begin showering 3 days following surgery, but do not submerge the incision under water.  ACTIVITY . For the first 3-5 days, it is important to rest and keep the operative leg elevated. You should, as a general rule, rest for 50 minutes and walk/stretch for 10 minutes per hour. After 5 days, you may slowly increase activity as tolerated.  . Perform the exercises you were provided twice a day for about 15-20 minutes each session. Begin these 2 days following surgery. . Walk with your walker as instructed. Use the walker until you are comfortable transitioning to a cane. Walk with the cane in the opposite hand of the operative leg. You may discontinue the cane once you are comfortable and walking steadily. . Avoid periods of inactivity such as sitting longer than an hour when not asleep. This helps prevent blood clots.  . Do not drive a car for 6 weeks or until released by your surgeon.  . Do not drive while taking narcotics.  TED HOSE STOCKINGS Wear the elastic stockings on both legs for three weeks following surgery during the day. You may remove them at night while sleeping.  WEIGHT BEARING Weight bearing as tolerated with assist device (walker, cane, etc) as directed, use it as long as suggested by your surgeon or therapist, typically at least 4-6 weeks.  POSTOPERATIVE CONSTIPATION PROTOCOL Constipation -   defined medically as fewer than three stools per week and severe constipation as less than one stool per week.  One of the most common issues patients have following surgery is constipation.  Even if you have a regular bowel pattern at home, your normal regimen is likely to be disrupted due to multiple reasons following surgery.  Combination of anesthesia, postoperative narcotics, change in  appetite and fluid intake all can affect your bowels.  In order to avoid complications following surgery, here are some recommendations in order to help you during your recovery period.  . Colace (docusate) - Pick up an over-the-counter form of Colace or another stool softener and take twice a day as long as you are requiring postoperative pain medications.  Take with a full glass of water daily.  If you experience loose stools or diarrhea, hold the colace until you stool forms back up.  If your symptoms do not get better within 1 week or if they get worse, check with your doctor. . Dulcolax (bisacodyl) - Pick up over-the-counter and take as directed by the product packaging as needed to assist with the movement of your bowels.  Take with a full glass of water.  Use this product as needed if not relieved by Colace only.  . MiraLax (polyethylene glycol) - Pick up over-the-counter to have on hand.  MiraLax is a solution that will increase the amount of water in your bowels to assist with bowel movements.  Take as directed and can mix with a glass of water, juice, soda, coffee, or tea.  Take if you go more than two days without a movement.Do not use MiraLax more than once per day. Call your doctor if you are still constipated or irregular after using this medication for 7 days in a row.  If you continue to have problems with postoperative constipation, please contact the office for further assistance and recommendations.  If you experience "the worst abdominal pain ever" or develop nausea or vomiting, please contact the office immediatly for further recommendations for treatment.  ITCHING  If you experience itching with your medications, try taking only a single pain pill, or even half a pain pill at a time.  You can also use Benadryl over the counter for itching or also to help with sleep.   MEDICATIONS See your medication summary on the "After Visit Summary" that the nursing staff will review with you  prior to discharge.  You may have some home medications which will be placed on hold until you complete the course of blood thinner medication.  It is important for you to complete the blood thinner medication as prescribed by your surgeon.  Continue your approved medications as instructed at time of discharge.  PRECAUTIONS If you experience chest pain or shortness of breath - call 911 immediately for transfer to the hospital emergency department.  If you develop a fever greater that 101 F, purulent drainage from wound, increased redness or drainage from wound, foul odor from the wound/dressing, or calf pain - CONTACT YOUR SURGEON.                                                   FOLLOW-UP APPOINTMENTS Make sure you keep all of your appointments after your operation with your surgeon and caregivers. You should call the office at the above phone number and   make an appointment for approximately two weeks after the date of your surgery or on the date instructed by your surgeon outlined in the "After Visit Summary".  RANGE OF MOTION AND STRENGTHENING EXERCISES  These exercises are designed to help you keep full movement of your hip joint. Follow your caregiver's or physical therapist's instructions. Perform all exercises about fifteen times, three times per day or as directed. Exercise both hips, even if you have had only one joint replacement. These exercises can be done on a training (exercise) mat, on the floor, on a table or on a bed. Use whatever works the best and is most comfortable for you. Use music or television while you are exercising so that the exercises are a pleasant break in your day. This will make your life better with the exercises acting as a break in routine you can look forward to.  . Lying on your back, slowly slide your foot toward your buttocks, raising your knee up off the floor. Then slowly slide your foot back down until your leg is straight again.  . Lying on your back spread  your legs as far apart as you can without causing discomfort.  . Lying on your side, raise your upper leg and foot straight up from the floor as far as is comfortable. Slowly lower the leg and repeat.  . Lying on your back, tighten up the muscle in the front of your thigh (quadriceps muscles). You can do this by keeping your leg straight and trying to raise your heel off the floor. This helps strengthen the largest muscle supporting your knee.  . Lying on your back, tighten up the muscles of your buttocks both with the legs straight and with the knee bent at a comfortable angle while keeping your heel on the floor.   IF YOU ARE TRANSFERRED TO A SKILLED REHAB FACILITY If the patient is transferred to a skilled rehab facility following release from the hospital, a list of the current medications will be sent to the facility for the patient to continue.  When discharged from the skilled rehab facility, please have the facility set up the patient's Home Health Physical Therapy prior to being released. Also, the skilled facility will be responsible for providing the patient with their medications at time of release from the facility to include their pain medication, the muscle relaxants, and their blood thinner medication. If the patient is still at the rehab facility at time of the two week follow up appointment, the skilled rehab facility will also need to assist the patient in arranging follow up appointment in our office and any transportation needs.  MAKE SURE YOU:  . Understand these instructions.  . Get help right away if you are not doing well or get worse.    DENTAL ANTIBIOTICS:  In most cases prophylactic antibiotics for Dental procdeures after total joint surgery are not necessary.  Exceptions are as follows:  1. History of prior total joint infection  2. Severely immunocompromised (Organ Transplant, cancer chemotherapy, Rheumatoid biologic meds such as Humera)  3. Poorly controlled  diabetes (A1C &gt; 8.0, blood glucose over 200)  If you have one of these conditions, contact your surgeon for an antibiotic prescription, prior to your dental procedure.    Pick up stool softner and laxative for home use following surgery while on pain medications. Do not submerge incision under water. Please use good hand washing techniques while changing dressing each day. May shower starting three days after surgery. Please   use a clean towel to pat the incision dry following showers. Continue to use ice for pain and swelling after surgery. Do not use any lotions or creams on the incision until instructed by your surgeon.  

## 2020-11-11 NOTE — Transfer of Care (Signed)
Immediate Anesthesia Transfer of Care Note  Patient: Tracy Jimenez  Procedure(s) Performed: TOTAL HIP ARTHROPLASTY ANTERIOR APPROACH (Left Hip)  Patient Location: PACU  Anesthesia Type:Spinal  Level of Consciousness: awake, alert  and patient cooperative  Airway & Oxygen Therapy: Patient Spontanous Breathing and Patient connected to face mask oxygen  Post-op Assessment: Report given to RN and Post -op Vital signs reviewed and stable  Post vital signs: Reviewed and stable  Last Vitals:  Vitals Value Taken Time  BP 100/76 11/11/20 1449  Temp    Pulse 62 11/11/20 1453  Resp 15 11/11/20 1453  SpO2 100 % 11/11/20 1453  Vitals shown include unvalidated device data.  Last Pain:  Vitals:   11/11/20 1109  TempSrc:   PainSc: 0-No pain         Complications: No complications documented.

## 2020-11-11 NOTE — Anesthesia Procedure Notes (Signed)
Spinal  Patient location during procedure: OR Start time: 11/11/2020 1:14 PM End time: 11/11/2020 1:16 PM Reason for block: surgical anesthesia Staffing Performed: anesthesiologist  Anesthesiologist: Gaynelle Adu, MD Preanesthetic Checklist Completed: patient identified, IV checked, risks and benefits discussed, surgical consent, monitors and equipment checked, pre-op evaluation and timeout performed Spinal Block Patient position: sitting Prep: DuraPrep Patient monitoring: cardiac monitor, continuous pulse ox and blood pressure Approach: midline Location: L3-4 Injection technique: single-shot Needle Needle type: Pencan  Needle gauge: 24 G Needle length: 9 cm Assessment Sensory level: T8 Events: CSF return Additional Notes Functioning IV was confirmed and monitors were applied. Sterile prep and drape, including hand hygiene and sterile gloves were used. The patient was positioned and the spine was prepped. The skin was anesthetized with lidocaine.  Free flow of clear CSF was obtained prior to injecting local anesthetic into the CSF.  The spinal needle aspirated freely following injection.  The needle was carefully withdrawn.  The patient tolerated the procedure well.

## 2020-11-12 ENCOUNTER — Encounter (HOSPITAL_COMMUNITY): Payer: Self-pay | Admitting: Orthopedic Surgery

## 2020-11-12 DIAGNOSIS — M1612 Unilateral primary osteoarthritis, left hip: Secondary | ICD-10-CM | POA: Diagnosis not present

## 2020-11-12 LAB — CBC
HCT: 31.9 % — ABNORMAL LOW (ref 36.0–46.0)
Hemoglobin: 10.5 g/dL — ABNORMAL LOW (ref 12.0–15.0)
MCH: 31.7 pg (ref 26.0–34.0)
MCHC: 32.9 g/dL (ref 30.0–36.0)
MCV: 96.4 fL (ref 80.0–100.0)
Platelets: 198 10*3/uL (ref 150–400)
RBC: 3.31 MIL/uL — ABNORMAL LOW (ref 3.87–5.11)
RDW: 12.2 % (ref 11.5–15.5)
WBC: 12.1 10*3/uL — ABNORMAL HIGH (ref 4.0–10.5)
nRBC: 0 % (ref 0.0–0.2)

## 2020-11-12 LAB — BASIC METABOLIC PANEL
Anion gap: 10 (ref 5–15)
BUN: 11 mg/dL (ref 8–23)
CO2: 21 mmol/L — ABNORMAL LOW (ref 22–32)
Calcium: 8.4 mg/dL — ABNORMAL LOW (ref 8.9–10.3)
Chloride: 101 mmol/L (ref 98–111)
Creatinine, Ser: 0.55 mg/dL (ref 0.44–1.00)
GFR, Estimated: >60 mL/min (ref >60)
Glucose, Bld: 179 mg/dL — ABNORMAL HIGH (ref 70–99)
Potassium: 3.8 mmol/L (ref 3.5–5.1)
Sodium: 132 mmol/L — ABNORMAL LOW (ref 135–145)

## 2020-11-12 MED ORDER — METHOCARBAMOL 500 MG PO TABS: 500.0000 mg | ORAL_TABLET | Freq: Four times a day (QID) | ORAL | 0 refills | Status: AC | PRN

## 2020-11-12 MED ORDER — PROMETHAZINE HCL 25 MG PO TABS: 12.5000 mg | ORAL_TABLET | Freq: Four times a day (QID) | ORAL | Status: DC | PRN

## 2020-11-12 MED ORDER — TRAMADOL HCL 50 MG PO TABS: 50.0000 mg | ORAL_TABLET | Freq: Four times a day (QID) | ORAL | 0 refills | Status: AC | PRN

## 2020-11-12 MED ORDER — ASPIRIN 325 MG PO TBEC: 325.0000 mg | DELAYED_RELEASE_TABLET | Freq: Two times a day (BID) | ORAL | 0 refills | Status: AC

## 2020-11-12 MED ORDER — ONDANSETRON HCL 4 MG PO TABS: 4.0000 mg | ORAL_TABLET | Freq: Four times a day (QID) | ORAL | 0 refills | Status: AC | PRN

## 2020-11-12 MED ORDER — TRAMADOL HCL 50 MG PO TABS
50.0000 mg | ORAL_TABLET | ORAL | Status: DC | PRN
  Administered 2020-11-12 – 2020-11-13 (×3): 50 mg via ORAL
  Filled 2020-11-12 (×3): qty 1

## 2020-11-12 NOTE — Progress Notes (Signed)
   Subjective: 1 Day Post-Op Procedure(s) (LRB): TOTAL HIP ARTHROPLASTY ANTERIOR APPROACH (Left) Patient reports pain as mild.   Patient seen in rounds by Dr. Lequita Halt. Patient is well, but has had some minor complaints of nausea/vomiting. No acute overnight events. Denies SOB, chest pain, or calf pain. Has had intermittent nausea at times last night and this morning.  We will begin physical therapy today.   Objective: Vital signs in last 24 hours: Temp:  [97.5 F (36.4 C)-98.1 F (36.7 C)] 97.9 F (36.6 C) (03/24 0327) Pulse Rate:  [57-76] 72 (03/24 0327) Resp:  [9-20] 16 (03/24 0327) BP: (100-174)/(52-76) 119/52 (03/24 0327) SpO2:  [95 %-100 %] 95 % (03/24 0327) Weight:  [57.1 kg] 57.1 kg (03/23 1032)  Intake/Output from previous day:  Intake/Output Summary (Last 24 hours) at 11/12/2020 0820 Last data filed at 11/12/2020 0738 Gross per 24 hour  Intake 3231.77 ml  Output 2300 ml  Net 931.77 ml     Intake/Output this shift: Total I/O In: 50 [P.O.:50] Out: 150 [Urine:150]  Labs: Recent Labs    11/12/20 0300  HGB 10.5*   Recent Labs    11/12/20 0300  WBC 12.1*  RBC 3.31*  HCT 31.9*  PLT 198   Recent Labs    11/12/20 0300  NA 132*  K 3.8  CL 101  CO2 21*  BUN 11  CREATININE 0.55  GLUCOSE 179*  CALCIUM 8.4*   No results for input(s): LABPT, INR in the last 72 hours.  Exam: General - Patient is Alert and Oriented Extremity - Neurologically intact Neurovascular intact Intact pulses distally Dorsiflexion/Plantar flexion intact Dressing - dressing C/D/I Motor Function - intact, moving foot and toes well on exam.   Past Medical History:  Diagnosis Date  . Anxiety   . Arthritis    knees, hips  . Peripheral vascular disease (HCC)    peripheral neuropathy Rt hand  . Sleep apnea     Assessment/Plan: 1 Day Post-Op Procedure(s) (LRB): TOTAL HIP ARTHROPLASTY ANTERIOR APPROACH (Left) Principal Problem:   OA (osteoarthritis) of hip Active Problems:    Osteoarthritis of left hip  Estimated body mass index is 21.61 kg/m as calculated from the following:   Height as of this encounter: 5\' 4"  (1.626 m).   Weight as of this encounter: 57.1 kg. Up with therapy  DVT Prophylaxis - Aspirin Weight bearing as tolerated. Continue therapy.  Patient unable to tolerate current pain medicine regiment, will adjust and reassess for tolerance prior to discharge.   Plan for two sessions with PT today, and if meeting goals, will plan for discharge this afternoon.   Patient to follow up in clinic in two weeks with Dr. .   The PDMP database was reviewed today prior to any opioid medications being prescribed to this patient.  Plan is to go Home after hospital stay.  Lequita Halt, MBA, PA-C Orthopedic Surgery 11/12/2020, 8:20 AM

## 2020-11-12 NOTE — TOC Transition Note (Signed)
Transition of Care [facility]) - CM/SW Discharge Note  Patient Details  Name: Tracy Jimenez MRN: [id number] Date of Birth: Jan 24, 1946  [facility] CM/SW Contact:  Sherie Don, LCSW Phone Number: 11/12/2020, 10:11 AM  Clinical Narrative: Patient to discharge home today. CSW met with patient to confirm discharge plan. Patient to discharge home with home exercise program (HEP) and has no DME needs at this time as she has a rolling walker and cane at home. TOC signing off.  Final next level of care: Home/Self Care Barriers to Discharge: No Barriers Identified  Patient Goals and CMS Choice Patient states their goals for this hospitalization and ongoing recovery are:: Discharge home CMS Medicare.gov Compare Post Acute Care list provided to:: Patient Choice offered to / list presented to : Patient  Discharge Plan and Services        DME Arranged: N/A DME Agency: NA  Readmission Risk Interventions No flowsheet data found.

## 2020-11-12 NOTE — Progress Notes (Signed)
Pt has been nauseated during day requiring zofran and reglan. Pt stated reglan has helped the most. Pt was able to work with physical therapy, but not as much due to nausea. Notified Nelia Shi PA, pt will stay another night and work with therapy in the morning.

## 2020-11-12 NOTE — Evaluation (Signed)
Physical Therapy Evaluation Patient Details Name: Tracy Jimenez MRN: [id number] DOB: 10-26-1945 Today's Date: 11/12/2020   History of Present Illness  Pt s/p L THR and with hx of PVD and anxiety  Clinical Impression  Pt s/p L THR and presents with decreased L LE strength/ROM, post op pain and significant nausea limiting functional mobility.  Pt should progress to dc home with family assist.    Follow Up Recommendations Follow surgeon's recommendation for DC plan and follow-up therapies    Equipment Recommendations  None recommended by PT    Recommendations for Other Services       Precautions / Restrictions Precautions Precautions: Knee;Fall Restrictions Weight Bearing Restrictions: No Other Position/Activity Restrictions: WBAT      Mobility  Bed Mobility Overal bed mobility: Needs Assistance Bed Mobility: Supine to Sit     Supine to sit: Min assist;Mod assist     General bed mobility comments: Increased time with cues for sequence and use of R LE to self assist.  Physical assist to manage L LE and to control trunk    Transfers Overall transfer level: Needs assistance   Transfers: Sit to/from Stand Sit to Stand: Min assist;Mod assist         General transfer comment: cues for LE management and use of UEs to self assist  Ambulation/Gait Ambulation/Gait assistance: Min assist;Mod assist Gait Distance (Feet): 5 Feet Assistive device: Rolling walker (2 wheeled) Gait Pattern/deviations: Step-to pattern;Decreased step length - left;Shuffle;Trunk flexed Gait velocity: decr   General Gait Details: Increased time with cues for posture, sequence and position from RW; Distance ltd by nausea  Stairs            Wheelchair Mobility    Modified Rankin (Stroke Patients Only)       Balance Overall balance assessment: Needs assistance Sitting-balance support: No upper extremity supported;Feet supported Sitting balance-Leahy Scale: Fair     Standing balance  support: Bilateral upper extremity supported Standing balance-Leahy Scale: Poor                               Pertinent Vitals/Pain Pain Assessment: 0-10 Pain Score: 8  Pain Location: L hip Pain Descriptors / Indicators: Aching;Grimacing;Guarding;Sore Pain Intervention(s): Limited activity within patient's tolerance;Monitored during session;Ice applied (pt has been declining pain meds 2* nausea)    Home Living Family/patient expects to be discharged to:: Private residence Living Arrangements: Spouse/significant other Available Help at Discharge: Family Type of Home: House Home Access: Level entry     Home Layout: Laundry or work area in basement Home Equipment: Environmental consultant - 2 wheels;Cane - quad Additional Comments: Pt's spouse is blind    Prior Function Level of Independence: Independent;Independent with assistive device(s)         Comments: used cane as needed     Hand Dominance        Extremity/Trunk Assessment   Upper Extremity Assessment Upper Extremity Assessment: Overall WFL for tasks assessed    Lower Extremity Assessment Lower Extremity Assessment: LLE deficits/detail LLE Deficits / Details: 2/5 strength at hip with AAROM at hip to 80 flex and 15 abd    Cervical / Trunk Assessment Cervical / Trunk Assessment: Normal  Communication   Communication: No difficulties  Cognition Arousal/Alertness: Awake/alert Behavior During Therapy: WFL for tasks assessed/performed Overall Cognitive Status: Within Functional Limits for tasks assessed  General Comments      Exercises Total Joint Exercises Ankle Circles/Pumps: AROM;Both;15 reps;Supine Quad Sets: AROM;Both;10 reps;Supine Heel Slides: AAROM;Left;20 reps;Supine Hip ABduction/ADduction: AAROM;Left;15 reps;Supine   Assessment/Plan    PT Assessment Patient needs continued PT services  PT Problem List Decreased strength;Decreased range of  motion;Decreased activity tolerance;Decreased balance;Decreased mobility;Decreased knowledge of use of DME;Pain       PT Treatment Interventions DME instruction;Gait training;Stair training;Functional mobility training;Therapeutic activities;Therapeutic exercise;Patient/family education;Balance training    PT Goals (Current goals can be found in the Care Plan section)  Acute Rehab PT Goals Patient Stated Goal: Get over this nausea and regain IND PT Goal Formulation: With patient Time For Goal Achievement: 11/19/20 Potential to Achieve Goals: Good    Frequency 7X/week   Barriers to discharge        Co-evaluation               AM-PAC PT "6 Clicks" Mobility  Outcome Measure Help needed turning from your back to your side while in a flat bed without using bedrails?: A Lot Help needed moving from lying on your back to sitting on the side of a flat bed without using bedrails?: A Lot Help needed moving to and from a bed to a chair (including a wheelchair)?: A Lot Help needed standing up from a chair using your arms (e.g., wheelchair or bedside chair)?: A Lot Help needed to walk in hospital room?: A Lot Help needed climbing 3-5 steps with a railing? : A Lot 6 Click Score: 12    End of Session Equipment Utilized During Treatment: Gait belt Activity Tolerance: Other (comment) (nausea) Patient left: in chair;with call bell/phone within reach;with nursing/sitter in room Nurse Communication: Mobility status PT Visit Diagnosis: Unsteadiness on feet (R26.81);Difficulty in walking, not elsewhere classified (R26.2)    Time: 1660-6301 PT Time Calculation (min) (ACUTE ONLY): 42 min   Charges:   PT Evaluation $PT Eval Low Complexity: 1 Low PT Treatments $Gait Training: 8-22 mins $Therapeutic Exercise: 8-22 mins        Tracy Jimenez PT Acute Rehabilitation Services Pager [phone] Office [phone]   Tracy Jimenez 11/12/2020, 12:42 PM

## 2020-11-12 NOTE — Progress Notes (Signed)
Physical Therapy Treatment Patient Details Name: Tracy Jimenez MRN: [id number] DOB: 11-18-1945 Today's Date: 11/12/2020    History of Present Illness Pt s/p L THR and with hx of PVD and anxiety    PT Comments    Pt continues to require increased time for task performance but with marked improvement in activity tolerance and min c/o nausea this pm.  Pt up to [facility] for toileting, ambulated limited distance in hall and assisted back to bed.  Follow Up Recommendations  Follow surgeon's recommendation for DC plan and follow-up therapies     Equipment Recommendations  None recommended by PT    Recommendations for Other Services       Precautions / Restrictions Precautions Precautions: Fall Restrictions Weight Bearing Restrictions: No Other Position/Activity Restrictions: WBAT    Mobility  Bed Mobility Overal bed mobility: Needs Assistance Bed Mobility: Sit to Supine     Supine to sit: Min assist;Mod assist Sit to supine: Min assist;Mod assist   General bed mobility comments: Increased time with cues for sequence and use of R LE to self assist;  Physical assist to manage LEs onto bed    Transfers Overall transfer level: Needs assistance   Transfers: Sit to/from Stand;Stand Pivot Transfers Sit to Stand: Min assist Stand pivot transfers: Min assist       General transfer comment: cues for LE management and use of UEs to self assist  Ambulation/Gait Ambulation/Gait assistance: Min assist Gait Distance (Feet): 100 Feet Assistive device: Rolling walker (2 wheeled) Gait Pattern/deviations: Step-to pattern;Decreased step length - left;Shuffle;Trunk flexed Gait velocity: decr   General Gait Details: Increased time with cues for posture, sequence and position from RW; Distance pain limited with min c/o nausea   Stairs             Wheelchair Mobility    Modified Rankin (Stroke Patients Only)       Balance Overall balance assessment: Needs  assistance Sitting-balance support: No upper extremity supported;Feet supported Sitting balance-Leahy Scale: Good     Standing balance support: Bilateral upper extremity supported Standing balance-Leahy Scale: Poor                              Cognition Arousal/Alertness: Awake/alert Behavior During Therapy: WFL for tasks assessed/performed Overall Cognitive Status: Within Functional Limits for tasks assessed                                        Exercises Total Joint Exercises Ankle Circles/Pumps: AROM;Both;15 reps;Supine Quad Sets: AROM;Both;10 reps;Supine Heel Slides: AAROM;Left;20 reps;Supine Hip ABduction/ADduction: AAROM;Left;15 reps;Supine    General Comments        Pertinent Vitals/Pain Pain Assessment: 0-10 Pain Score: 7  Pain Location: L hip Pain Descriptors / Indicators: Aching;Grimacing;Guarding;Sore Pain Intervention(s): Limited activity within patient's tolerance;Monitored during session;Premedicated before session;Ice applied    Home Living Family/patient expects to be discharged to:: Private residence Living Arrangements: Spouse/significant other Available Help at Discharge: Family Type of Home: House Home Access: Level entry   Home Layout: Laundry or work area in basement Home Equipment: Environmental consultant - 2 wheels;Cane - quad Additional Comments: Pt's spouse is blind    Prior Function Level of Independence: Independent;Independent with assistive device(s)      Comments: used cane as needed   PT Goals (current goals can now be found in the care plan section) Acute Rehab  PT Goals Patient Stated Goal: Get over this nausea and regain IND PT Goal Formulation: With patient Time For Goal Achievement: 11/19/20 Potential to Achieve Goals: Good Progress towards PT goals: Progressing toward goals    Frequency    7X/week      PT Plan Current plan remains appropriate    Co-evaluation              AM-PAC PT "6 Clicks"  Mobility   Outcome Measure  Help needed turning from your back to your side while in a flat bed without using bedrails?: A Lot Help needed moving from lying on your back to sitting on the side of a flat bed without using bedrails?: A Lot Help needed moving to and from a bed to a chair (including a wheelchair)?: A Lot Help needed standing up from a chair using your arms (e.g., wheelchair or bedside chair)?: A Little Help needed to walk in hospital room?: A Little Help needed climbing 3-5 steps with a railing? : A Lot 6 Click Score: 14    End of Session Equipment Utilized During Treatment: Gait belt Activity Tolerance: Other (comment) (nausea) Patient left: in bed;with call bell/phone within reach;with nursing/sitter in room;with bed alarm set Nurse Communication: Mobility status PT Visit Diagnosis: Unsteadiness on feet (R26.81);Difficulty in walking, not elsewhere classified (R26.2)     Time: 9323-5573 PT Time Calculation (min) (ACUTE ONLY): 23 min  Charges:  $Gait Training: 8-22 mins $Therapeutic Exercise: 8-22 mins $Therapeutic Activity: 8-22 mins                     Mauro Kaufmann PT Acute Rehabilitation Services Pager [phone] Office [phone]    BRADSHAW,HUNTER 11/12/2020, 2:56 PM

## 2020-11-13 DIAGNOSIS — M1612 Unilateral primary osteoarthritis, left hip: Secondary | ICD-10-CM | POA: Diagnosis not present

## 2020-11-13 LAB — BASIC METABOLIC PANEL
Anion gap: 7 (ref 5–15)
BUN: 11 mg/dL (ref 8–23)
CO2: 26 mmol/L (ref 22–32)
Calcium: 8.8 mg/dL — ABNORMAL LOW (ref 8.9–10.3)
Chloride: 106 mmol/L (ref 98–111)
Creatinine, Ser: 0.45 mg/dL (ref 0.44–1.00)
GFR, Estimated: >60 mL/min (ref >60)
Glucose, Bld: 126 mg/dL — ABNORMAL HIGH (ref 70–99)
Potassium: 4.7 mmol/L (ref 3.5–5.1)
Sodium: 139 mmol/L (ref 135–145)

## 2020-11-13 LAB — CBC
HCT: 31.2 % — ABNORMAL LOW (ref 36.0–46.0)
Hemoglobin: 10.2 g/dL — ABNORMAL LOW (ref 12.0–15.0)
MCH: 31.9 pg (ref 26.0–34.0)
MCHC: 32.7 g/dL (ref 30.0–36.0)
MCV: 97.5 fL (ref 80.0–100.0)
Platelets: 206 10*3/uL (ref 150–400)
RBC: 3.2 MIL/uL — ABNORMAL LOW (ref 3.87–5.11)
RDW: 12.6 % (ref 11.5–15.5)
WBC: 15.4 10*3/uL — ABNORMAL HIGH (ref 4.0–10.5)
nRBC: 0 % (ref 0.0–0.2)

## 2020-11-13 NOTE — Progress Notes (Signed)
   Subjective: 2 Days Post-Op Procedure(s) (LRB): TOTAL HIP ARTHROPLASTY ANTERIOR APPROACH (Left) Patient reports pain as mild.   Patient seen in rounds by Dr. Lequita Halt. Patient is well, and has had no acute complaints or problems. No acute overnight events. Had significant nausea yesterday, which has improved this am. Denies SOB, chest pain, or calf pain. Ambulated 100 feet with PT yesterday. Will continue therapy today.  Plan is to go Home after hospital stay.  Objective: Vital signs in last 24 hours: Temp:  [97.9 F (36.6 C)-98.6 F (37 C)] 97.9 F (36.6 C) (03/25 0533) Pulse Rate:  [69-81] 70 (03/25 0533) Resp:  [14-18] 15 (03/25 0533) BP: (116-140)/(51-62) 140/62 (03/25 0533) SpO2:  [94 %-97 %] 97 % (03/25 0533)  Intake/Output from previous day:  Intake/Output Summary (Last 24 hours) at 11/13/2020 0717 Last data filed at 11/13/2020 0600 Gross per 24 hour  Intake 1934.31 ml  Output 2575 ml  Net -640.69 ml    Intake/Output this shift: No intake/output data recorded.  Labs: Recent Labs    11/12/20 0300 11/13/20 0259  HGB 10.5* 10.2*   Recent Labs    11/12/20 0300 11/13/20 0259  WBC 12.1* 15.4*  RBC 3.31* 3.20*  HCT 31.9* 31.2*  PLT 198 206   Recent Labs    11/12/20 0300 11/13/20 0259  NA 132* 139  K 3.8 4.7  CL 101 106  CO2 21* 26  BUN 11 11  CREATININE 0.55 0.45  GLUCOSE 179* 126*  CALCIUM 8.4* 8.8*   No results for input(s): LABPT, INR in the last 72 hours.  Exam: General - Patient is Alert and Oriented Extremity - Neurologically intact Neurovascular intact Intact pulses distally Dorsiflexion/Plantar flexion intact Dressing/Incision - clean, dry, no drainage Motor Function - intact, moving foot and toes well on exam.   Past Medical History:  Diagnosis Date  . Anxiety   . Arthritis    knees, hips  . Peripheral vascular disease (HCC)    peripheral neuropathy Rt hand  . Sleep apnea     Assessment/Plan: 2 Days Post-Op Procedure(s)  (LRB): TOTAL HIP ARTHROPLASTY ANTERIOR APPROACH (Left) Principal Problem:   OA (osteoarthritis) of hip Active Problems:   Osteoarthritis of left hip  Estimated body mass index is 21.61 kg/m as calculated from the following:   Height as of this encounter: 5\' 4"  (1.626 m).   Weight as of this encounter: 57.1 kg. Up with therapy  DVT Prophylaxis - Aspirin and TED hose Weight-bearing as tolerated Continue therapy.   Will plan for one session with PT today, and if meeting goals, will plan for discharge this afternoon.   Patient to follow up in two weeks with Dr. in clinic.   The PDMP database was reviewed today prior to any opioid medications being prescribed to this patient.  Lequita Halt, MBA, PA-C Orthopedic Surgery 11/13/2020, 7:17 AM

## 2020-11-13 NOTE — Progress Notes (Signed)
Physical Therapy Treatment Patient Details Name: Tracy Jimenez MRN: [id number] DOB: 10-Jan-1946 Today's Date: 11/13/2020    History of Present Illness Pt s/p L THR and with hx of PVD and anxiety    PT Comments    Pt in good spirits this am and with marked improvement in ability to perform all mobility tasks.  Pt up to ambulate increased distance in hall, negotiated stairs, reviewed car transfers and performed HEP - written instruction provided and reviewed.  Pt states feels much better about dc home than yesterday.   Follow Up Recommendations  Follow surgeon's recommendation for DC plan and follow-up therapies     Equipment Recommendations  None recommended by PT    Recommendations for Other Services       Precautions / Restrictions Precautions Precautions: Fall Restrictions Weight Bearing Restrictions: No LLE Weight Bearing: Weight bearing as tolerated    Mobility  Bed Mobility Overal bed mobility: Needs Assistance Bed Mobility: Supine to Sit     Supine to sit: Min guard     General bed mobility comments: Increased time with cues for sequence and use of R LE to self assist; Pt utilizing gait belt to assist L LE    Transfers Overall transfer level: Needs assistance Equipment used: Rolling walker (2 wheeled) Transfers: Sit to/from Stand Sit to Stand: Min guard         General transfer comment: cues for LE management and use of UEs to self assist  Ambulation/Gait Ambulation/Gait assistance: Min guard;Supervision Gait Distance (Feet): 160 Feet Assistive device: Rolling walker (2 wheeled) Gait Pattern/deviations: Step-to pattern;Step-through pattern;Decreased step length - right;Decreased step length - left;Shuffle;Trunk flexed Gait velocity: decr   General Gait Details: cues for posture, position from RW and initial sequence   Stairs Stairs: Yes Stairs assistance: Min assist Stair Management: One rail Right;Step to pattern;Forwards;With cane Number of  Stairs: 5 General stair comments: cues for sequence and foot/cane placement   Wheelchair Mobility    Modified Rankin (Stroke Patients Only)       Balance Overall balance assessment: Needs assistance Sitting-balance support: No upper extremity supported;Feet supported Sitting balance-Leahy Scale: Good     Standing balance support: No upper extremity supported Standing balance-Leahy Scale: Fair                              Cognition Arousal/Alertness: Awake/alert Behavior During Therapy: WFL for tasks assessed/performed Overall Cognitive Status: Within Functional Limits for tasks assessed                                        Exercises Total Joint Exercises Ankle Circles/Pumps: AROM;Both;15 reps;Supine Quad Sets: AROM;Both;10 reps;Supine Heel Slides: AAROM;Left;20 reps;Supine Hip ABduction/ADduction: AAROM;Left;15 reps;Supine Long Arc Quad: AAROM;Left;10 reps;Seated    General Comments        Pertinent Vitals/Pain Pain Assessment: 0-10 Pain Score: 4  Pain Location: L hip Pain Descriptors / Indicators: Aching;Sore Pain Intervention(s): Limited activity within patient's tolerance;Monitored during session;Premedicated before session;Ice applied    Home Living                      Prior Function            PT Goals (current goals can now be found in the care plan section) Acute Rehab PT Goals Patient Stated Goal: Regain IND PT Goal Formulation: With  patient Time For Goal Achievement: 11/19/20 Potential to Achieve Goals: Good Progress towards PT goals: Progressing toward goals    Frequency    7X/week      PT Plan Current plan remains appropriate    Co-evaluation              AM-PAC PT "6 Clicks" Mobility   Outcome Measure  Help needed turning from your back to your side while in a flat bed without using bedrails?: A Little Help needed moving from lying on your back to sitting on the side of a flat bed  without using bedrails?: A Little Help needed moving to and from a bed to a chair (including a wheelchair)?: A Little Help needed standing up from a chair using your arms (e.g., wheelchair or bedside chair)?: A Little Help needed to walk in hospital room?: A Little Help needed climbing 3-5 steps with a railing? : A Little 6 Click Score: 18    End of Session Equipment Utilized During Treatment: Gait belt Activity Tolerance: Patient tolerated treatment well Patient left: in chair;with call bell/phone within reach Nurse Communication: Mobility status PT Visit Diagnosis: Unsteadiness on feet (R26.81);Difficulty in walking, not elsewhere classified (R26.2)     Time: 8657-8469 PT Time Calculation (min) (ACUTE ONLY): 34 min  Charges:  $Gait Training: 8-22 mins $Therapeutic Exercise: 8-22 mins                     Tracy Jimenez PT Acute Rehabilitation Services Pager [phone] Office [phone]    Tracy Jimenez 11/13/2020, 1:32 PM

## 2020-11-18 NOTE — Discharge Summary (Signed)
Physician Discharge Summary   Patient ID: Tracy Jimenez MRN: [id number] DOB/AGE: [birth year] 76 y.o.  Admit date: 11/11/2020 Discharge date: 11/13/2020  Primary Diagnosis: Osteoarthritis of left hip  Admission Diagnoses:  Past Medical History:  Diagnosis Date  . Anxiety   . Arthritis    knees, hips  . Peripheral vascular disease (HCC)    peripheral neuropathy Rt hand  . Sleep apnea    Discharge Diagnoses:   Principal Problem:   OA (osteoarthritis) of hip Active Problems:   Osteoarthritis of left hip  Estimated body mass index is 21.61 kg/m as calculated from the following:   Height as of this encounter: 5\' 4"  (1.626 m).   Weight as of this encounter: 57.1 kg.  Procedure:  Procedure(s) (LRB): TOTAL HIP ARTHROPLASTY ANTERIOR APPROACH (Left)   Consults: None  HPI: Tracy Jimenez is a 75 y.o. female who has advanced end-  stage arthritis of their Left  hip with progressively worsening pain and  dysfunction.The patient has failed nonoperative management and presents for  total hip arthroplasty.   Laboratory Data: Admission on 11/11/2020, Discharged on 11/13/2020  Component Date Value Ref Range Status  . ABO/RH(D) 11/11/2020    Final                   Value:O POS Performed at [facility], [address] [place], [place]   . WBC 11/12/2020 12.1* 4.0 - 10.5 K/uL Final  . RBC 11/12/2020 3.31* 3.87 - 5.11 MIL/uL Final  . Hemoglobin 11/12/2020 10.5* 12.0 - 15.0 g/dL Final  . HCT 11/14/2020 31.9* 36.0 - 46.0 % Final  . MCV 11/12/2020 96.4  80.0 - 100.0 fL Final  . MCH 11/12/2020 31.7  26.0 - 34.0 pg Final  . MCHC 11/12/2020 32.9  30.0 - 36.0 g/dL Final  . RDW 11/14/2020 12.2  11.5 - 15.5 % Final  . Platelets 11/12/2020 198  150 - 400 K/uL Final  . nRBC 11/12/2020 0.0  0.0 - 0.2 % Final   Performed at [facility], [address], [place], [place]  . Sodium 11/12/2020 132* 135 - 145 mmol/L Final  . Potassium  11/12/2020 3.8  3.5 - 5.1 mmol/L Final  . Chloride 11/12/2020 101  98 - 111 mmol/L Final  . CO2 11/12/2020 21* 22 - 32 mmol/L Final  . Glucose, Bld 11/12/2020 179* 70 - 99 mg/dL Final   Glucose reference range applies only to samples taken after fasting for at least 8 hours.  . BUN 11/12/2020 11  8 - 23 mg/dL Final  . Creatinine, Ser 11/12/2020 0.55  0.44 - 1.00 mg/dL Final  . Calcium 11/14/2020 8.4* 8.9 - 10.3 mg/dL Final  . GFR, Estimated 11/12/2020 >60  >60 mL/min Final   Comment: (NOTE) Calculated using the CKD-EPI Creatinine Equation (2021)   . Anion gap 11/12/2020 10  5 - 15 Final   Performed at [facility], [address] [place], [place]  . WBC 11/13/2020 15.4* 4.0 - 10.5 K/uL Final  . RBC 11/13/2020 3.20* 3.87 - 5.11 MIL/uL Final  . Hemoglobin 11/13/2020 10.2* 12.0 - 15.0 g/dL Final  . HCT 11/15/2020 31.2* 36.0 - 46.0 % Final  . MCV 11/13/2020 97.5  80.0 - 100.0 fL Final  . MCH 11/13/2020 31.9  26.0 - 34.0 pg Final  . MCHC 11/13/2020 32.7  30.0 - 36.0 g/dL Final  . RDW 11/15/2020 12.6  11.5 - 15.5 % Final  . Platelets  11/13/2020 206  150 - 400 K/uL Final  . nRBC 11/13/2020 0.0  0.0 - 0.2 % Final   Performed at [facility], [address] [place], [place] [address]  . Sodium 11/13/2020 139  135 - 145 mmol/L Final   DELTA CHECK NOTED  . Potassium 11/13/2020 4.7  3.5 - 5.1 mmol/L Final   DELTA CHECK NOTED  . Chloride 11/13/2020 106  98 - 111 mmol/L Final  . CO2 11/13/2020 26  22 - 32 mmol/L Final  . Glucose, Bld 11/13/2020 126* 70 - 99 mg/dL Final   Glucose reference range applies only to samples taken after fasting for at least 8 hours.  . BUN 11/13/2020 11  8 - 23 mg/dL Final  . Creatinine, Ser 11/13/2020 0.45  0.44 - 1.00 mg/dL Final  . Calcium 62/95/284103/25/2022 8.8* 8.9 - 10.3 mg/dL Final  . GFR, Estimated 11/13/2020 >60  >60 mL/min Final   Comment: (NOTE) Calculated using the CKD-EPI Creatinine Equation (2021)   . Anion  gap 11/13/2020 7  5 - 15 Final   Performed at [facility], [address] [place], [place] [address]  Hospital Outpatient Visit on 11/07/2020  Component Date Value Ref Range Status  . SARS Coronavirus 2 11/07/2020 NEGATIVE  NEGATIVE Final   Comment: (NOTE) SARS-CoV-2 target nucleic acids are NOT DETECTED.  The SARS-CoV-2 RNA is generally detectable in upper and lower respiratory specimens during the acute phase of infection. Negative results do not preclude SARS-CoV-2 infection, do not rule out co-infections with other pathogens, and should not be used as the sole basis for treatment or other patient management decisions. Negative results must be combined with clinical observations, patient history, and epidemiological information. The expected result is Negative.  Fact Sheet for Patients: HairSlick.nohttps://www.fda.gov/media/138098/download  Fact Sheet for Healthcare Providers: quierodirigir.comhttps://www.fda.gov/media/138095/download  This test is not yet approved or cleared by the [place] FDA and  has been authorized for detection and/or diagnosis of SARS-CoV-2 by FDA under an Emergency Use Authorization (EUA). This EUA will remain  in effect (meaning this test can be used) for the duration of the COVID-19 declaration under Se                          ction 564(b)(1) of the Act, 21 U.S.C. section 360bbb-3(b)(1), unless the authorization is terminated or revoked sooner.  Performed at [facility], [address] [place], [place] [address]   Hospital Outpatient Visit on 11/03/2020  Component Date Value Ref Range Status  . WBC 11/03/2020 6.9  4.0 - 10.5 K/uL Final  . RBC 11/03/2020 4.40  3.87 - 5.11 MIL/uL Final  . Hemoglobin 11/03/2020 14.1  12.0 - 15.0 g/dL Final  . HCT 53/66/440303/15/2022 43.1  36.0 - 46.0 % Final  . MCV 11/03/2020 98.0  80.0 - 100.0 fL Final  . MCH 11/03/2020 32.0  26.0 - 34.0 pg Final  . MCHC 11/03/2020 32.7  30.0 - 36.0 g/dL Final  . RDW 47/42/595603/15/2022  12.5  11.5 - 15.5 % Final  . Platelets 11/03/2020 258  150 - 400 K/uL Final  . nRBC 11/03/2020 0.0  0.0 - 0.2 % Final   Performed at [facility], [address] [place], [place] [address]  . Sodium 11/03/2020 141  135 - 145 mmol/L Final  . Potassium 11/03/2020 4.5  3.5 - 5.1 mmol/L Final  . Chloride 11/03/2020 106  98 - 111 mmol/L Final  . CO2  11/03/2020 28  22 - 32 mmol/L Final  . Glucose, Bld 11/03/2020 112* 70 - 99 mg/dL Final   Glucose reference range applies only to samples taken after fasting for at least 8 hours.  . BUN 11/03/2020 14  8 - 23 mg/dL Final  . Creatinine, Ser 11/03/2020 0.44  0.44 - 1.00 mg/dL Final  . Calcium 40/98/1191 9.8  8.9 - 10.3 mg/dL Final  . Total Protein 11/03/2020 7.5  6.5 - 8.1 g/dL Final  . Albumin 47/82/9562 4.5  3.5 - 5.0 g/dL Final  . AST 13/03/6577 29  15 - 41 U/L Final  . ALT 11/03/2020 34  0 - 44 U/L Final  . Alkaline Phosphatase 11/03/2020 70  38 - 126 U/L Final  . Total Bilirubin 11/03/2020 0.6  0.3 - 1.2 mg/dL Final  . GFR, Estimated 11/03/2020 >60  >60 mL/min Final   Comment: (NOTE) Calculated using the CKD-EPI Creatinine Equation (2021)   . Anion gap 11/03/2020 7  5 - 15 Final   Performed at [facility], [address] [place], [place] [address]  . Prothrombin Time 11/03/2020 12.3  11.4 - 15.2 seconds Final  . INR 11/03/2020 1.0  0.8 - 1.2 Final   Comment: (NOTE) INR goal varies based on device and disease states. Performed at [facility], [address], [place], [place] [address]   . aPTT 11/03/2020 30  24 - 36 seconds Final   Performed at [facility], [address], [place], [place] [address]  . ABO/RH(D) 11/03/2020 O POS   Final  . Antibody Screen 11/03/2020 NEG   Final  . Sample Expiration 11/03/2020 11/14/2020,2359   Final  . Extend sample reason 11/03/2020    Final                   Value:NO TRANSFUSIONS OR PREGNANCY IN THE PAST 3  MONTHS Performed at [facility], [address], [place], [place] [address]   . MRSA, PCR 11/03/2020 NEGATIVE  NEGATIVE Final  . Staphylococcus aureus 11/03/2020 POSITIVE* NEGATIVE Final   Comment: (NOTE) The Xpert SA Assay (FDA approved for NASAL specimens in patients 12 years of age and older), is one component of a comprehensive surveillance program. It is not intended to diagnose infection nor to guide or monitor treatment. Performed at [facility], [address], [place], [place] [address]      X-Rays:DG Pelvis Portable  Result Date: 11/11/2020 CLINICAL DATA:  Post LEFT total hip arthroplasty EXAM: PORTABLE PELVIS 1-2 VIEWS COMPARISON:  Portable exam 1505 hours compared to earlier intraoperative study of 11/11/2020 FINDINGS: LEFT hip prosthesis in expected position. No acute fracture, dislocation, or bone destruction. Bones appear demineralized. Degenerative changes of the RIGHT hip joint. IMPRESSION: LEFT hip prosthesis without acute complication. Electronically Signed   By: Ulyses Southward M.D.   On: 11/11/2020 15:40   DG C-Arm 1-60 Min-No Report  Result Date: 11/11/2020 Fluoroscopy was utilized by the requesting physician.  No radiographic interpretation.   DG HIP OPERATIVE UNILAT W OR W/O PELVIS LEFT  Result Date: 11/11/2020 CLINICAL DATA:  Left hip arthroplasty. EXAM: OPERATIVE LEFT HIP (WITH PELVIS IF PERFORMED) 5 VIEWS TECHNIQUE: Fluoroscopic spot image(s) were submitted for interpretation post-operatively. COMPARISON:  No prior. FINDINGS: Total left hip replacement.  Hardware intact.  Anatomic alignment. IMPRESSION: Total left hip replacement with anatomic alignment. Electronically Signed   By: Maisie Fus  Register   On: 11/11/2020 15:57    EKG:No orders found  for this or any previous visit.   Hospital Course: Tracy Jimenez is a 75 y.o. who was admitted to [facility]. They were brought to the operating room on 11/11/2020 and  underwent Procedure(s): TOTAL HIP ARTHROPLASTY ANTERIOR APPROACH.  Patient tolerated the procedure well and was later transferred to the recovery room and then to the orthopaedic floor for postoperative care. They were given PO and IV analgesics for pain control following their surgery. They were given 24 hours of postoperative antibiotics of  Anti-infectives (From admission, onward)   Start     Dose/Rate Route Frequency Ordered Stop   11/11/20 2000  ceFAZolin (ANCEF) IVPB 2g/100 mL premix        2 g 200 mL/hr over 30 Minutes Intravenous Every 6 hours 11/11/20 1621 11/12/20 0227   11/11/20 1045  ceFAZolin (ANCEF) IVPB 2g/100 mL premix        2 g 200 mL/hr over 30 Minutes Intravenous On call to O.R. 11/11/20 1032 11/11/20 1318     and started on DVT prophylaxis in the form of Aspirin.   PT and OT were ordered for total joint protocol. Discharge planning consulted to help with postop disposition and equipment needs. Patient had significant nausea during the night on the evening of surgery. They started to get up OOB with therapy on 11/12/20. Her nausea was unable to be adequately controlled until late in the day on POD#1, which resulted in the patient staying overnight one additional night to ensure adequate nausea control/hydration. Continued to work with therapy into POD #2. Pt was seen during rounds on day two and was ready to go home pending progress with therapy. Pt worked with therapy for two additional sessions and was meeting their goals. She was discharged to home later that day in stable condition.  Diet: Regular diet Activity: WBAT Follow-up: in two weeks Disposition: Home Discharged Condition: good   Discharge Instructions    Call MD / Call 911   Complete by: As directed    If you experience chest pain or shortness of breath, CALL 911 and be transported to the hospital emergency room.  If you develope a fever above 101 F, pus (white drainage) or increased drainage or redness at the  wound, or calf pain, call your surgeon's office.   Call MD / Call 911   Complete by: As directed    If you experience chest pain or shortness of breath, CALL 911 and be transported to the hospital emergency room.  If you develope a fever above 101 F, pus (white drainage) or increased drainage or redness at the wound, or calf pain, call your surgeon's office.   Change dressing   Complete by: As directed    You have an adhesive waterproof bandage over the incision. Leave this in place until your first follow-up appointment. Once you remove this you will not need to place another bandage.   Change dressing   Complete by: As directed    You have an adhesive waterproof bandage over the incision. Leave this in place until your first follow-up appointment. Once you remove this you will not need to place another bandage.   Constipation Prevention   Complete by: As directed    Drink plenty of fluids.  Prune juice may be helpful.  You may use a stool softener, such as Colace (over the counter) 100 mg twice a day.  Use MiraLax (over the counter) for constipation as needed.   Constipation Prevention   Complete by: As  directed    Drink plenty of fluids.  Prune juice may be helpful.  You may use a stool softener, such as Colace (over the counter) 100 mg twice a day.  Use MiraLax (over the counter) for constipation as needed.   Diet - low sodium heart healthy   Complete by: As directed    Diet - low sodium heart healthy   Complete by: As directed    Do not sit on low chairs, stoools or toilet seats, as it may be difficult to get up from low surfaces   Complete by: As directed    Do not sit on low chairs, stoools or toilet seats, as it may be difficult to get up from low surfaces   Complete by: As directed    Driving restrictions   Complete by: As directed    No driving for two weeks   Driving restrictions   Complete by: As directed    No driving for two weeks   TED hose   Complete by: As directed     Use stockings (TED hose) for three weeks on both leg(s).  You may remove them at night for sleeping.   TED hose   Complete by: As directed    Use stockings (TED hose) for three weeks on both leg(s).  You may remove them at night for sleeping.   Weight bearing as tolerated   Complete by: As directed    Weight bearing as tolerated   Complete by: As directed      Allergies as of 11/13/2020      Reactions   Mobic [meloxicam] Hives, Itching   Kenalog [triamcinolone] Itching, Rash      Medication List    TAKE these medications   aspirin 325 MG EC tablet Take 1 tablet (325 mg total) by mouth 2 (two) times daily. Then take one 81 mg aspirin once a day for three weeks. Then discontinue aspirin. What changed:   medication strength  how much to take  when to take this  additional instructions   atorvastatin 10 MG tablet Commonly known as: LIPITOR Take 10 mg by mouth daily.   Calcium-Vitamin D 600-125 MG-UNIT Tabs Take 1 tablet by mouth daily.   methocarbamol 500 MG tablet Commonly known as: ROBAXIN Take 1 tablet (500 mg total) by mouth every 6 (six) hours as needed for muscle spasms.   multivitamin with minerals tablet Take 1 tablet by mouth daily.   Omega-3 500 MG Caps Take 500 mg by mouth daily.   ondansetron 4 MG tablet Commonly known as: ZOFRAN Take 1 tablet (4 mg total) by mouth every 6 (six) hours as needed for nausea.   traMADol 50 MG tablet Commonly known as: ULTRAM Take 1-2 tablets (50-100 mg total) by mouth every 6 (six) hours as needed for severe pain.   triamcinolone 55 MCG/ACT Aero nasal inhaler Commonly known as: NASACORT Place 2 sprays into the nose daily as needed (allergies and Sinus).            Discharge Care Instructions  (From admission, onward)         Start     Ordered   11/13/20 0000  Weight bearing as tolerated        11/13/20 0721   11/13/20 0000  Change dressing       Comments: You have an adhesive waterproof bandage over the  incision. Leave this in place until your first follow-up appointment. Once you remove this you will not need to  place another bandage.   11/13/20 0721   11/12/20 0000  Weight bearing as tolerated        11/12/20 0827   11/12/20 0000  Change dressing       Comments: You have an adhesive waterproof bandage over the incision. Leave this in place until your first follow-up appointment. Once you remove this you will not need to place another bandage.   11/12/20 0827          Follow-up Information    Ollen Gross, MD In 2 weeks.   Specialty: Orthopedic Surgery Contact information: [address] [place] [address] [phone]               Signed: Nelia Shi, MBA, PA-C Orthopedic Surgery 11/18/2020, 7:44 AM

## 2021-12-29 IMAGING — RF DG HIP (WITH PELVIS) OPERATIVE*L*
2 series · 10 of 10 positions shown · non-contrast
Comparison: No prior.

CLINICAL DATA: Left hip arthroplasty.

EXAM:
OPERATIVE LEFT HIP (WITH PELVIS IF PERFORMED) 5 VIEWS
TECHNIQUE: Fluoroscopic spot image(s) were submitted for interpretation
post-operatively.

[Series 1: run · 5 of 5 slices shown (1 of 2)]
[im 1/5]
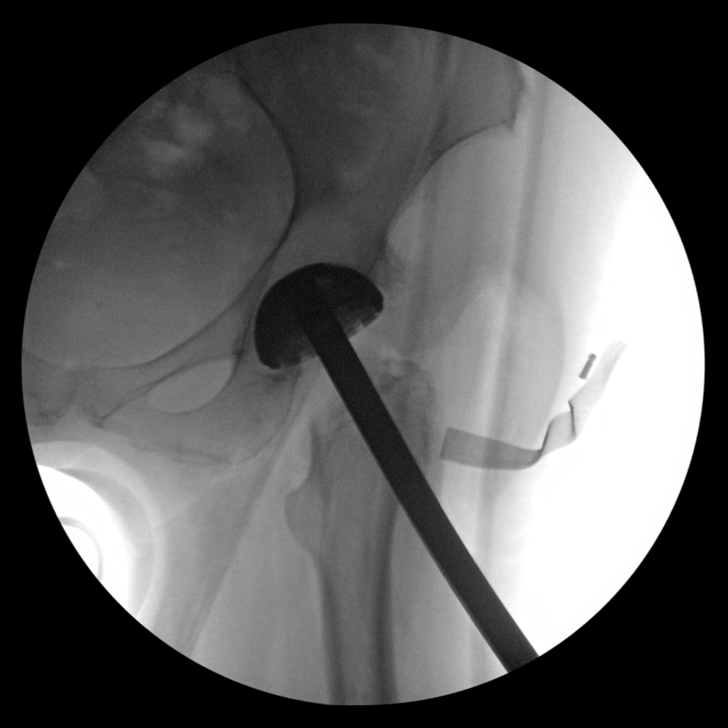
[im 2/5]
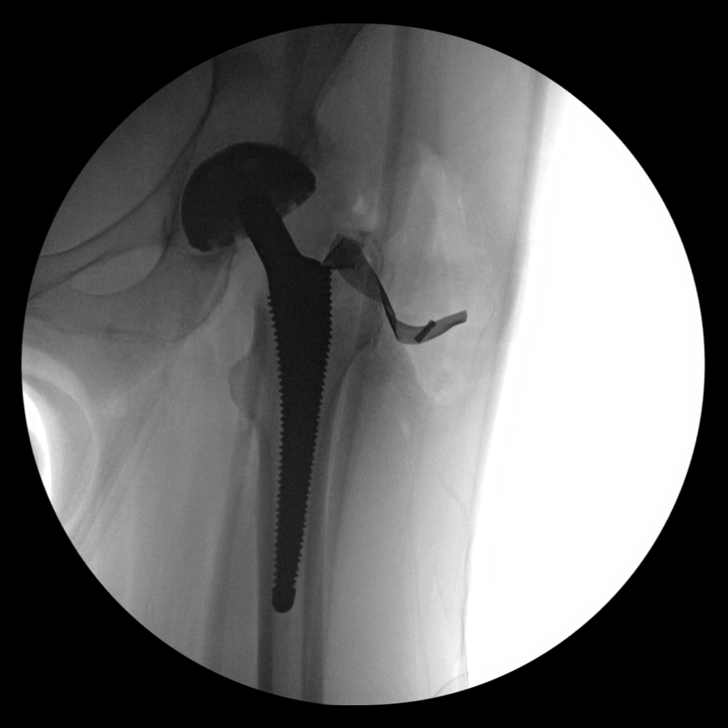
[im 3/5]
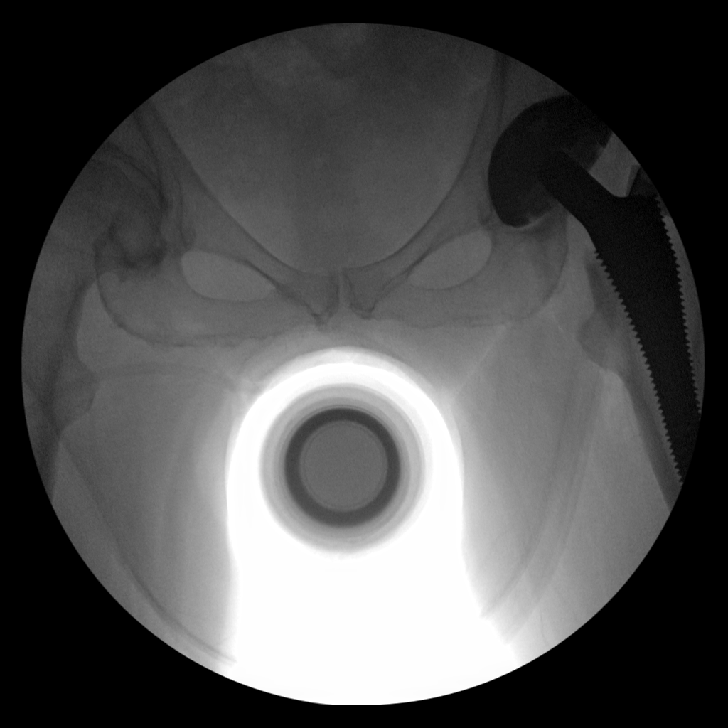
[im 4/5]
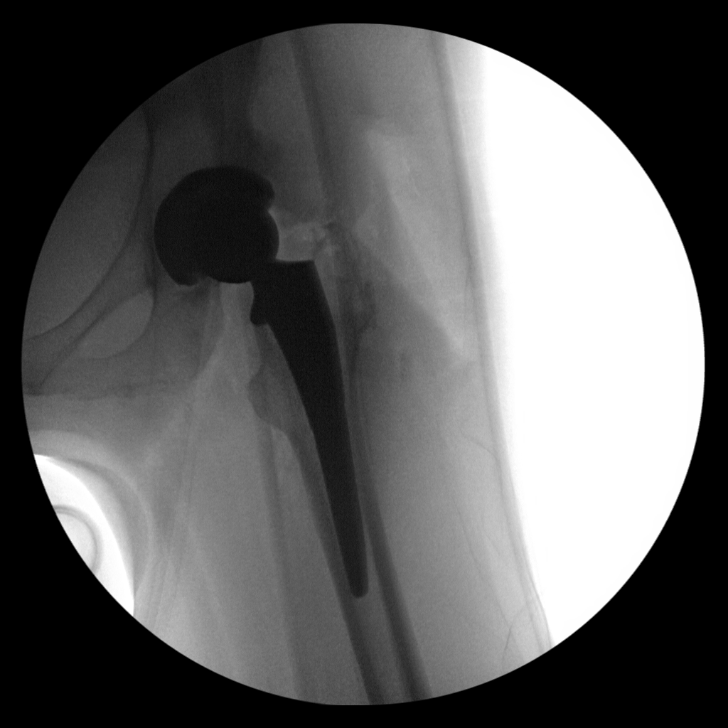
[im 5/5]
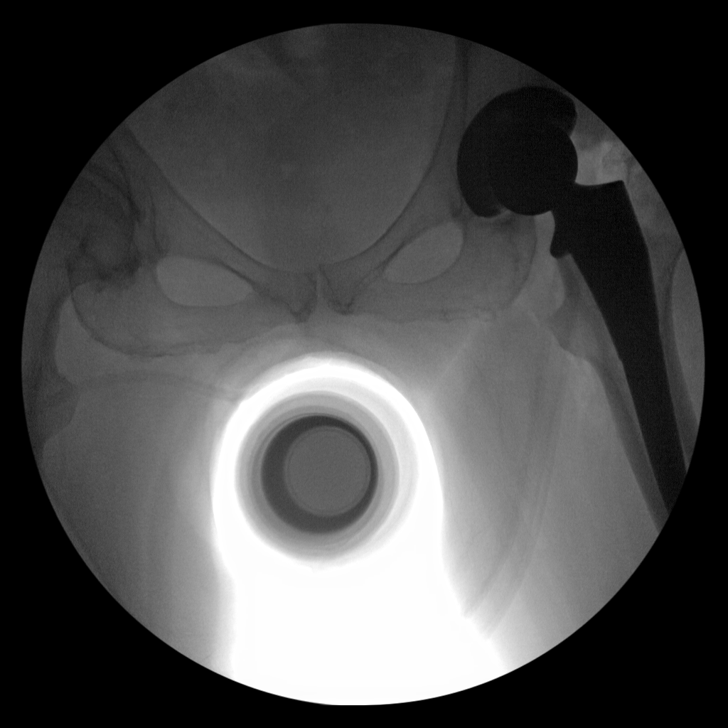

[Series 1: run · 5 of 5 slices shown (2 of 2)]
[im 1/5]
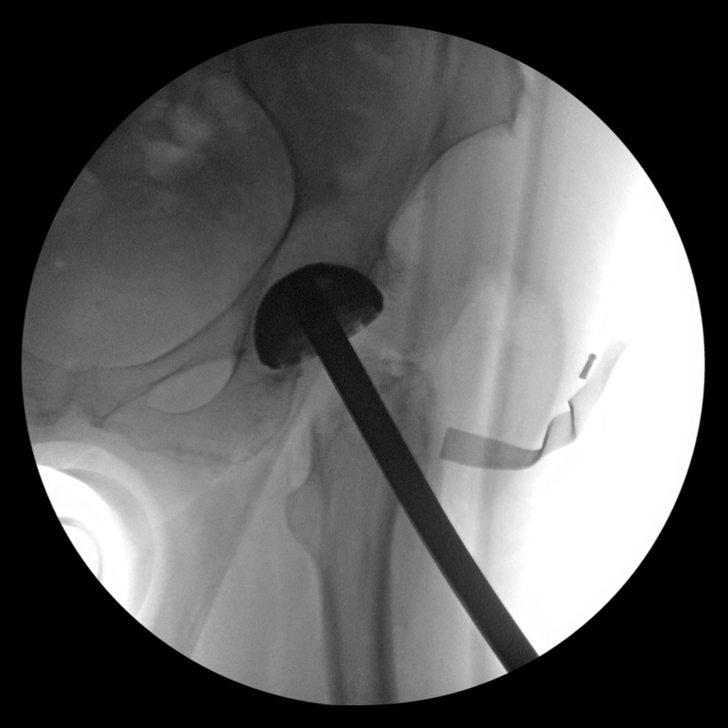
[im 2/5]
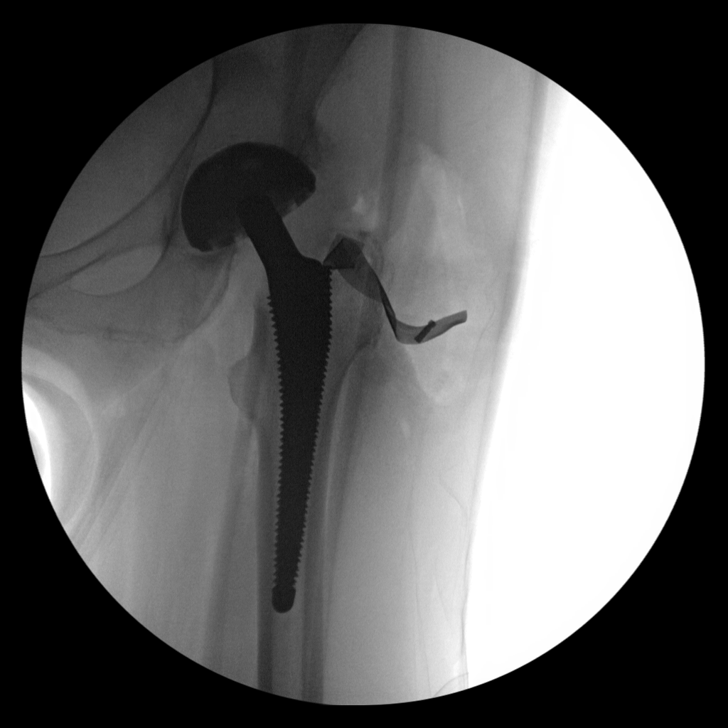
[im 3/5]
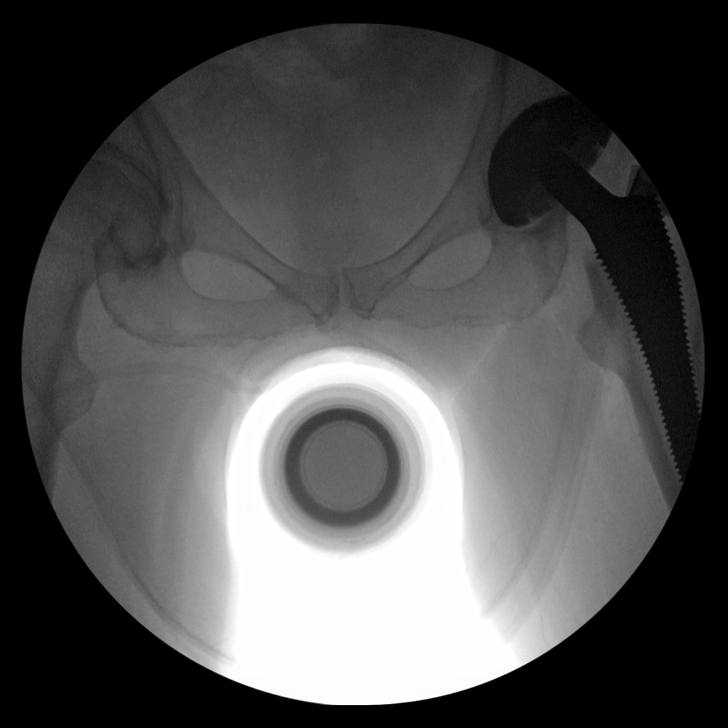
[im 4/5]
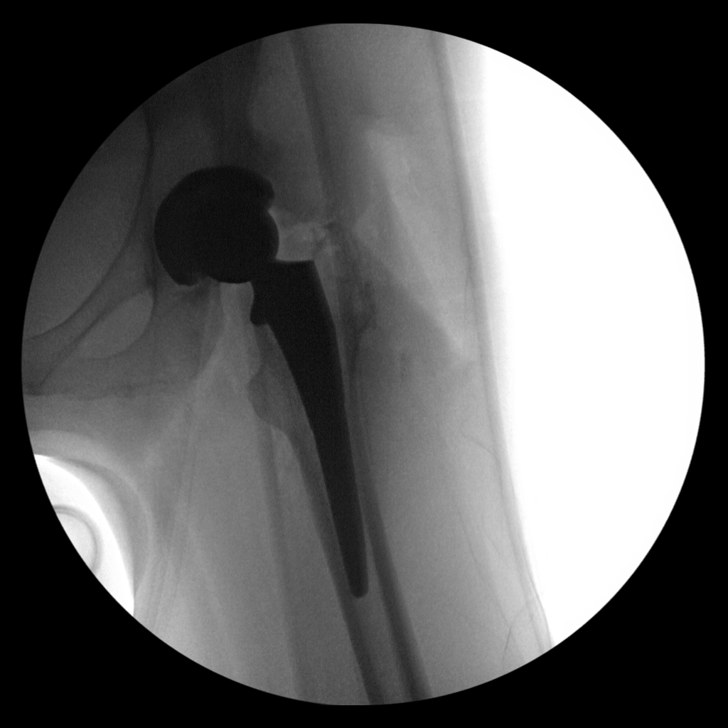
[im 5/5]
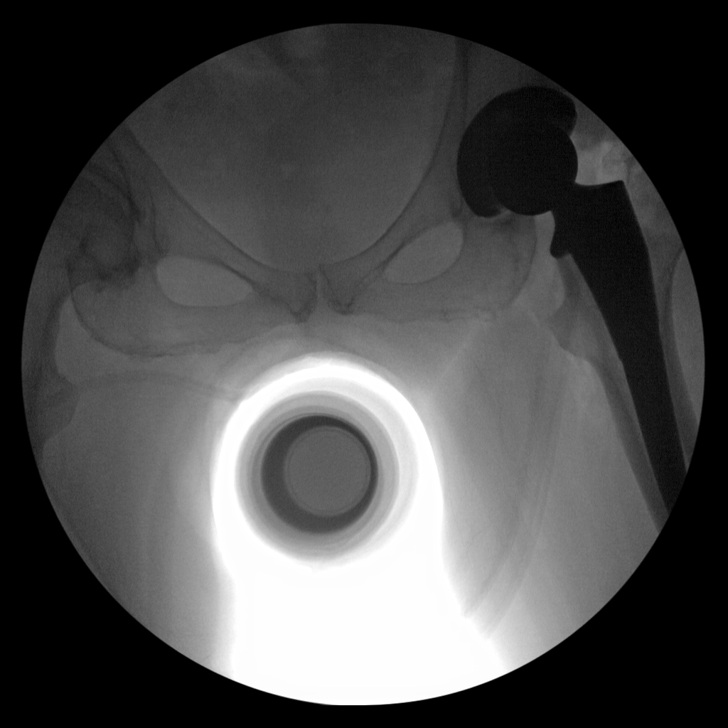

[10 of 10 positions shown; findings below may reference images not displayed]

FINDINGS: Total left hip replacement.  Hardware intact.  Anatomic alignment.
IMPRESSION: Total left hip replacement with anatomic alignment.

## 2021-12-29 IMAGING — DX DG PORTABLE PELVIS
1 series · 1 of 1 positions shown · non-contrast
Comparison: Portable exam 5292 hours compared to earlier
intraoperative study of 11/11/2020

CLINICAL DATA: Post LEFT total hip arthroplasty

EXAM:
PORTABLE PELVIS 1-2 VIEWS

[pelvis ap]
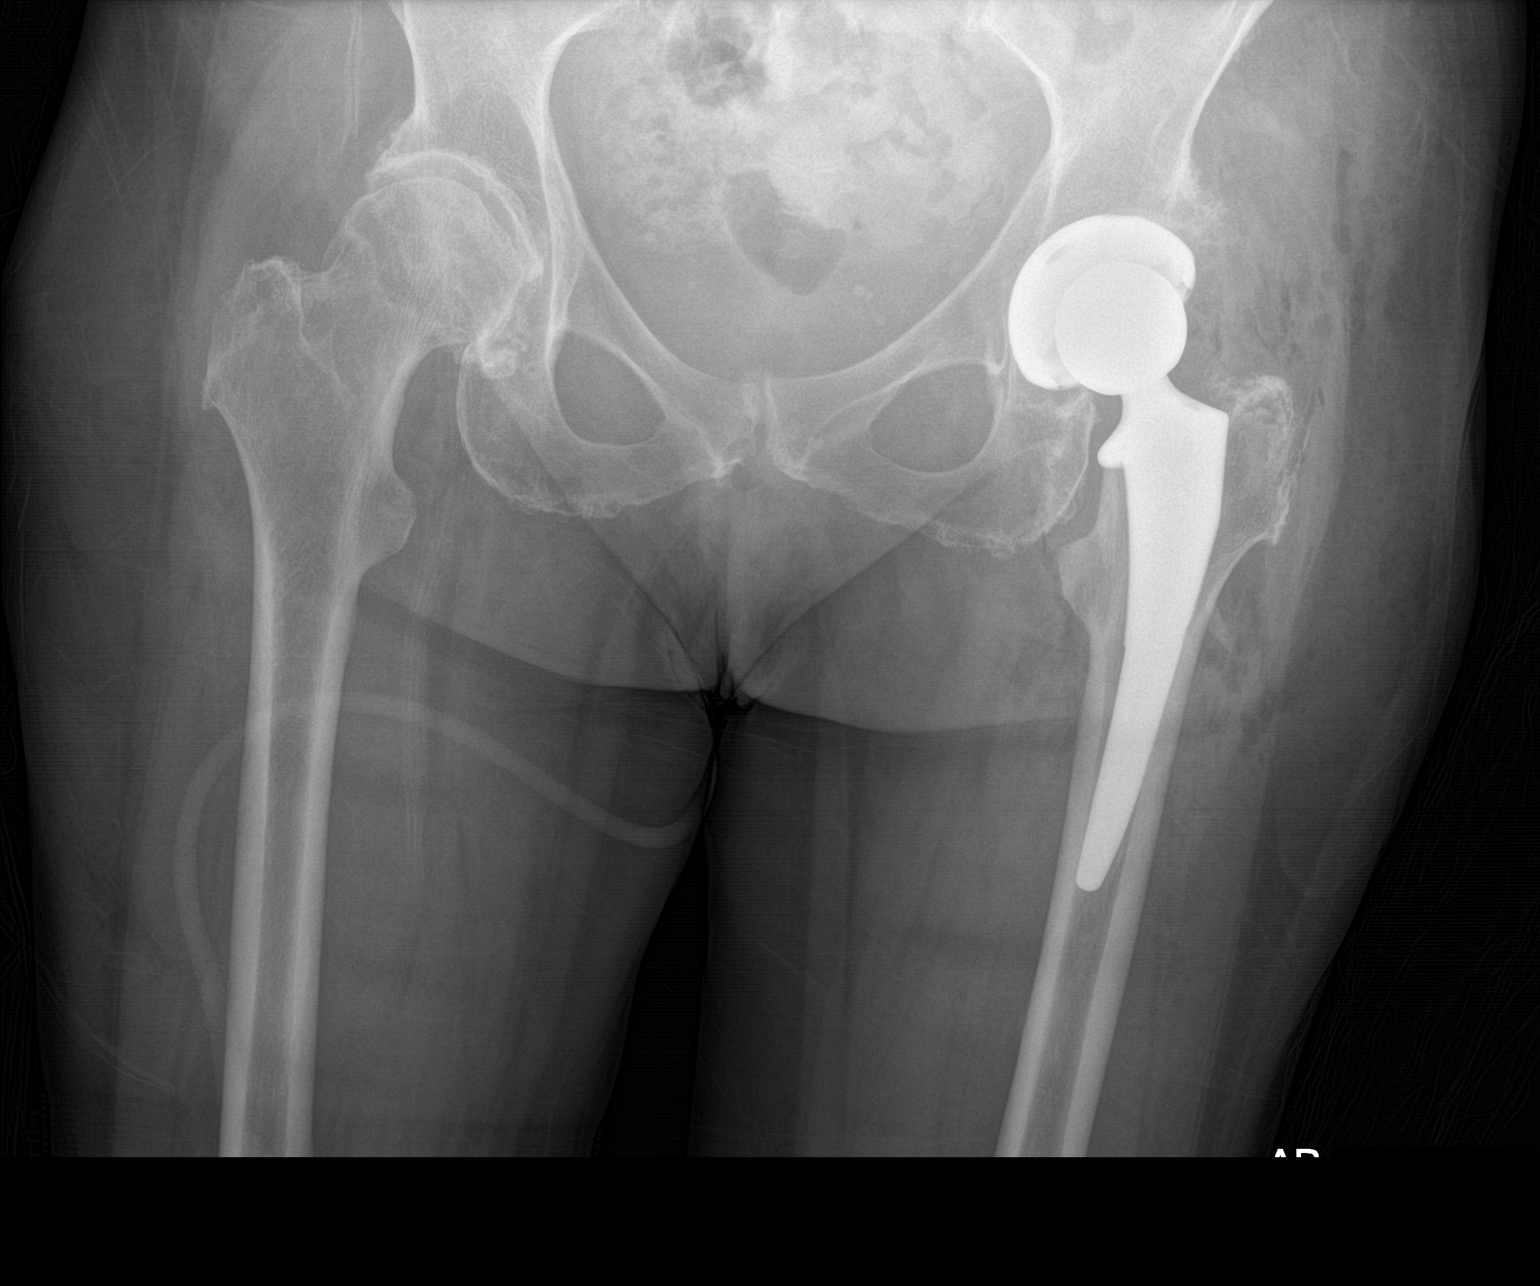

[1 of 1 positions shown; findings below may reference images not displayed]

FINDINGS: LEFT hip prosthesis in expected position.

No acute fracture, dislocation, or bone destruction.

Bones appear demineralized.

Degenerative changes of the RIGHT hip joint.
IMPRESSION: LEFT hip prosthesis without acute complication.
